# Patient Record
Sex: Male | Born: 1965 | Race: Black or African American | Hispanic: No | Marital: Single | Smoking: Never smoker
Health system: Southern US, Community
[De-identification: ages and names within clinical notes are randomized; demographics above are authoritative.]

## PROBLEM LIST (undated history)

## (undated) DIAGNOSIS — L03012 Cellulitis of left finger: Secondary | ICD-10-CM

## (undated) DIAGNOSIS — M109 Gout, unspecified: Secondary | ICD-10-CM

## (undated) DIAGNOSIS — Z22322 Carrier or suspected carrier of Methicillin resistant Staphylococcus aureus: Secondary | ICD-10-CM

## (undated) DIAGNOSIS — I10 Essential (primary) hypertension: Secondary | ICD-10-CM

## (undated) DIAGNOSIS — M869 Osteomyelitis, unspecified: Secondary | ICD-10-CM

## (undated) DIAGNOSIS — L089 Local infection of the skin and subcutaneous tissue, unspecified: Secondary | ICD-10-CM

## (undated) DIAGNOSIS — K219 Gastro-esophageal reflux disease without esophagitis: Secondary | ICD-10-CM

## (undated) HISTORY — DX: Essential (primary) hypertension: I10

---

## 2002-03-22 ENCOUNTER — Emergency Department (HOSPITAL_COMMUNITY): Admission: EM | Admit: 2002-03-22 | Discharge: 2002-03-23 | Payer: Self-pay | Admitting: Emergency Medicine

## 2004-07-29 ENCOUNTER — Emergency Department (HOSPITAL_COMMUNITY): Admission: EM | Admit: 2004-07-29 | Discharge: 2004-07-29 | Payer: Self-pay | Admitting: Emergency Medicine

## 2006-08-10 ENCOUNTER — Emergency Department (HOSPITAL_COMMUNITY): Admission: EM | Admit: 2006-08-10 | Discharge: 2006-08-10 | Payer: Self-pay | Admitting: Emergency Medicine

## 2011-02-25 ENCOUNTER — Emergency Department (HOSPITAL_COMMUNITY)
Admission: EM | Admit: 2011-02-25 | Discharge: 2011-02-25 | Disposition: A | Discharge status: Home / Self Care | Payer: Self-pay | Attending: Emergency Medicine | Admitting: Emergency Medicine

## 2011-02-25 ENCOUNTER — Emergency Department (HOSPITAL_COMMUNITY): Payer: Self-pay

## 2011-02-25 DIAGNOSIS — M7730 Calcaneal spur, unspecified foot: Secondary | ICD-10-CM | POA: Insufficient documentation

## 2011-02-25 DIAGNOSIS — M79609 Pain in unspecified limb: Secondary | ICD-10-CM | POA: Insufficient documentation

## 2011-03-08 ENCOUNTER — Ambulatory Visit (INDEPENDENT_AMBULATORY_CARE_PROVIDER_SITE_OTHER): Payer: Self-pay | Admitting: Surgery

## 2011-12-13 ENCOUNTER — Emergency Department (INDEPENDENT_AMBULATORY_CARE_PROVIDER_SITE_OTHER)
Admission: EM | Admit: 2011-12-13 | Discharge: 2011-12-13 | Disposition: A | Discharge status: Nursing Home (Includes ICF) | Payer: Self-pay | Source: Home / Self Care | Attending: Family Medicine | Admitting: Family Medicine

## 2011-12-13 ENCOUNTER — Emergency Department (HOSPITAL_COMMUNITY): Payer: Self-pay

## 2011-12-13 ENCOUNTER — Encounter (HOSPITAL_COMMUNITY): Payer: Self-pay | Admitting: *Deleted

## 2011-12-13 ENCOUNTER — Emergency Department (HOSPITAL_COMMUNITY)
Admission: EM | Admit: 2011-12-13 | Discharge: 2011-12-13 | Disposition: A | Discharge status: Home / Self Care | Payer: Self-pay | Attending: Emergency Medicine | Admitting: Emergency Medicine

## 2011-12-13 DIAGNOSIS — R51 Headache: Secondary | ICD-10-CM

## 2011-12-13 DIAGNOSIS — R55 Syncope and collapse: Secondary | ICD-10-CM

## 2011-12-13 DIAGNOSIS — R42 Dizziness and giddiness: Secondary | ICD-10-CM | POA: Insufficient documentation

## 2011-12-13 DIAGNOSIS — R5381 Other malaise: Secondary | ICD-10-CM | POA: Insufficient documentation

## 2011-12-13 DIAGNOSIS — R531 Weakness: Secondary | ICD-10-CM

## 2011-12-13 DIAGNOSIS — R630 Anorexia: Secondary | ICD-10-CM | POA: Insufficient documentation

## 2011-12-13 DIAGNOSIS — R5383 Other fatigue: Secondary | ICD-10-CM | POA: Insufficient documentation

## 2011-12-13 LAB — URINE MICROSCOPIC-ADD ON

## 2011-12-13 LAB — DIFFERENTIAL
Basophils Absolute: 0 10*3/uL (ref 0.0–0.1)
Basophils Relative: 1 % (ref 0–1)
Eosinophils Absolute: 0 10*3/uL (ref 0.0–0.7)
Eosinophils Relative: 0 % (ref 0–5)
Lymphocytes Relative: 38 % (ref 12–46)
Lymphs Abs: 1.3 10*3/uL (ref 0.7–4.0)
Monocytes Absolute: 0.4 10*3/uL (ref 0.1–1.0)
Neutro Abs: 1.7 10*3/uL (ref 1.7–7.7)
Neutrophils Relative %: 50 % (ref 43–77)

## 2011-12-13 LAB — COMPREHENSIVE METABOLIC PANEL
ALT: 22 U/L (ref 0–53)
AST: 38 U/L — ABNORMAL HIGH (ref 0–37)
Albumin: 3.8 g/dL (ref 3.5–5.2)
Alkaline Phosphatase: 85 U/L (ref 39–117)
BUN: 11 mg/dL (ref 6–23)
CO2: 22 mEq/L (ref 19–32)
Calcium: 8.9 mg/dL (ref 8.4–10.5)
Chloride: 100 mEq/L (ref 96–112)
Creatinine, Ser: 1.19 mg/dL (ref 0.50–1.35)
GFR calc Af Amer: 82 mL/min — ABNORMAL LOW (ref >90)
GFR calc non Af Amer: 71 mL/min — ABNORMAL LOW (ref >90)
Glucose, Bld: 88 mg/dL (ref 70–99)
Potassium: 3.2 mEq/L — ABNORMAL LOW (ref 3.5–5.1)
Sodium: 135 mEq/L (ref 135–145)
Total Bilirubin: 0.4 mg/dL (ref 0.3–1.2)

## 2011-12-13 LAB — CBC
HCT: 47.3 % (ref 39.0–52.0)
Hemoglobin: 16.8 g/dL (ref 13.0–17.0)
MCHC: 35.5 g/dL (ref 30.0–36.0)
MCV: 85.7 fL (ref 78.0–100.0)
Platelets: 216 10*3/uL (ref 150–400)
RBC: 5.52 MIL/uL (ref 4.22–5.81)
RDW: 13.1 % (ref 11.5–15.5)
WBC: 3.4 10*3/uL — ABNORMAL LOW (ref 4.0–10.5)

## 2011-12-13 LAB — URINALYSIS, ROUTINE W REFLEX MICROSCOPIC
Bilirubin Urine: NEGATIVE
Hgb urine dipstick: NEGATIVE
Ketones, ur: 15 mg/dL — AB
Leukocytes, UA: NEGATIVE
Nitrite: NEGATIVE
Protein, ur: 30 mg/dL — AB
Specific Gravity, Urine: 1.016 (ref 1.005–1.030)
Urobilinogen, UA: 1 mg/dL (ref 0.0–1.0)

## 2011-12-13 LAB — POCT I-STAT TROPONIN I

## 2011-12-13 LAB — GLUCOSE, CAPILLARY: Glucose-Capillary: 128 mg/dL — ABNORMAL HIGH (ref 70–99)

## 2011-12-13 MED ORDER — POTASSIUM CHLORIDE CRYS ER 20 MEQ PO TBCR
40.0000 meq | EXTENDED_RELEASE_TABLET | Freq: Once | ORAL | Status: AC
  Administered 2011-12-13: 40 meq via ORAL
  Filled 2011-12-13: qty 2

## 2011-12-13 NOTE — Discharge Instructions (Signed)
Transferred to Emergency Department for further evaluation.

## 2011-12-13 NOTE — Discharge Instructions (Signed)
Your lab work showed slightly low potassium level, otherwise normal. Make sure you eat well and get plenty of rest. When standing up, take your time, so you dont get dizzy again. Please find a primary care doctor and follow up with them. Return if worsening.   Near-Syncope Near-syncope is sudden weakness, dizziness, or feeling like you might pass out (faint). This may occur when getting up after sitting or while standing for a long period of time. Near-syncope can be caused by a drop in blood pressure. This is a common reaction, but it may occur to a greater degree in people taking medicines to control their blood pressure. Fainting often occurs when the blood pressure or pulse is too low to provide enough blood flow to the brain to keep you conscious. Fainting and near-syncope are not usually due to serious medical problems. However, certain people should be more cautious in the event of near-syncope, including elderly patients, patients with diabetes, and patients with a history of heart conditions (especially irregular rhythms).  CAUSES   Drop in blood pressure.   Physical pain.   Dehydration.   Heat exhaustion.   Emotional distress.   Low blood sugar.   Internal bleeding.   Heart and circulatory problems.   Infections.  SYMPTOMS   Dizziness.   Feeling sick to your stomach (nauseous).   Nearly fainting.   Body numbness.   Turning pale.   Tunnel vision.   Weakness.  HOME CARE INSTRUCTIONS   Lie down right away if you start feeling like you might faint. Breathe deeply and steadily. Wait until all the symptoms have passed. Most of these episodes last only a few minutes. You may feel tired for several hours.   Drink enough fluids to keep your urine clear or pale yellow.   If you are taking blood pressure or heart medicine, get up slowly, taking several minutes to sit and then stand. This can reduce dizziness that is caused by a drop in blood pressure.  SEEK IMMEDIATE  MEDICAL CARE IF:   You have a severe headache.   Unusual pain develops in the chest, abdomen, or back.   There is bleeding from the mouth or rectum, or you have black or tarry stool.   An irregular heartbeat or a very rapid pulse develops.   You have repeated fainting or seizure-like jerking during an episode.   You faint when sitting or lying down.   You develop confusion.   You have difficulty walking.   Severe weakness develops.   Vision problems develop.  MAKE SURE YOU:   Understand these instructions.   Will watch your condition.   Will get help right away if you are not doing well or get worse.  Document Released: 08/13/2005 Document Revised: 08/02/2011 Document Reviewed: 09/29/2010 [employer] Patient Information 2012 [place], [place].

## 2011-12-13 NOTE — ED Notes (Addendum)
FRnces  Has  Been  sick this week  He  Has  Had  Diarrhea  Loss  Of  appetite   And  Some  Weakness to  Boot  -  He  Reports  At  Work today  He  Got  Week  -layed  Down  And  Carried  To a  Room by [employer] workers  -  He  States  He  Was  Too weak to move  For  About 10  mins  But remembers  Everything    -  He  Is  Awake alert  Ambulates  With   A   Steady  Fluid  Gait -  His mucous membranes  Are  Dry  However  - His sclera  Is   Slightly  Pale  Looking however -  Pt states  His  Wife noticed  This  As  Well  Earlier - he  denys  Any pain now  Or  Before

## 2011-12-13 NOTE — ED Provider Notes (Addendum)
Medical screening examination/treatment/procedure(s) were conducted as a shared visit with non-physician practitioner(s) and myself.  I personally evaluated the patient during the encounter  Pt with a near syncopal event at work.  PT complains of mild headache but otherwise no significant symptoms.  On exam alert in no distress.   No meningismus.  5/5 strength bilateral UE and LE.  No facial droop.    Will check labs, EKG, head ct.  Low suspicion of SAH.   Rate: 69  Rhythm: normal sinus rhythm  QRS Axis: normal  Intervals: normal  ST/T Wave abnormalities: normal except early repol pattern anterior  Conduction Disutrbances:none  Narrative Interpretation: no sig changes  Old EKG Reviewed: none available    Celene Kras, MD 12/13/11 1407

## 2011-12-13 NOTE — ED Notes (Signed)
Pt sent here from [facility] for further eval of near syncpal episode this am while at work. Also reported having right sided headache and numbness to fingertips since yesterday. No neuro deficits noted.

## 2011-12-13 NOTE — ED Notes (Signed)
Reports had a syncopal episode at work today, denies injury. States felt really hot, flushed, nauseous no emesis & lightheaded prior to syncopal episode. No CP, SOB, palpitations, n/v/d, fever, cold, cough.  C/o headache left temple since yesterday, describes it as nagging. Took advil with some relief. Denies blurred vision. Denies nausea presently. Also c/o of decreased appetite past week with poor oral intake.

## 2011-12-13 NOTE — ED Provider Notes (Signed)
History     CSN: [id number]  Arrival date & time 12/13/11  1141   First MD Initiated Contact with Patient 12/13/11 1152      Chief Complaint  Patient presents with  . Weakness    (Consider location/radiation/quality/duration/timing/severity/associated sxs/prior treatment) HPI Comments: Rickardo presents for evaluation of presyncopal episode happened earlier this morning. He reports that he was at work this morning when he bent over to put down a water bottle, when he stood up he became dizzy lightheaded and fell backwards. He denies striking his head or complete loss of consciousness. He does report persistent weakness and lightheadedness after the event, and had to be helped up to his feet by 2 other people. Upon further interview, he reports a right-sided headache since yesterday, numbness in his fingertips yesterday. He also reports a disturbance in his taste since Tuesday of this week. He denies any nausea or vomiting. He denies any visual disturbance. He denies any significant past medical history, does not smoke, denies alcohol. He denies any seizure history. He has no personal or family history of diabetes. He does not take any medications and is not taking any recent over-the-counter medications. He does not have a primary care doctor, but he is seeing a surgeon in the near future for evaluation of a lesion on the top of his scalp. He denies any history of headaches, migraines or otherwise.  Patient is a 46 y.o. male presenting with weakness. The history is provided by the patient.  Weakness The primary symptoms include headaches, dizziness and paresthesias. Primary symptoms do not include syncope, loss of consciousness, altered mental status, seizures, visual change, focal weakness, loss of sensation, speech change, memory loss, nausea or vomiting. The symptoms began yesterday. The symptoms are unchanged. The neurological symptoms are focal. The symptoms occurred after standing up.  The  headache began yesterday. Headache is a new problem. Location/region(s) of the headache: right unilateral. The headache is associated with paresthesias, weakness and loss of balance. The headache is not associated with aura, eye pain or visual change.  He describes the dizziness as lightheadedness. The dizziness began today. The dizziness has been unchanged since its onset. Dizziness also occurs with weakness. Dizziness does not occur with blurred vision, tinnitus, hearing loss, nausea, vomiting or diaphoresis.   Additional symptoms include weakness and loss of balance. Additional symptoms do not include aura or tinnitus. Medical issues do not include seizures, cerebral vascular accident, cancer, alcohol use, drug use, diabetes, hypertension or recent surgery.    History reviewed. No pertinent past medical history.  History reviewed. No pertinent past surgical history.  History reviewed. No pertinent family history.  History  Substance Use Topics  . Smoking status: Not on file  . Smokeless tobacco: Not on file  . Alcohol Use: Not on file      Review of Systems  Constitutional: Negative for diaphoresis.  HENT: Negative for tinnitus.   Eyes: Negative.  Negative for blurred vision and pain.  Respiratory: Negative.   Cardiovascular: Negative.  Negative for syncope.  Gastrointestinal: Negative.  Negative for nausea and vomiting.  Genitourinary: Negative.   Musculoskeletal: Negative.   Skin: Negative.   Neurological: Positive for dizziness, weakness, light-headedness, numbness, headaches, paresthesias and loss of balance. Negative for speech change, focal weakness, seizures, loss of consciousness and syncope.  Psychiatric/Behavioral: Negative for memory loss and altered mental status.    Allergies  Review of patient's allergies indicates no known allergies.  Home Medications  No current outpatient prescriptions on file.  BP 122/81  Pulse 78  Temp(Src) 98 F (36.7 C) (Oral)   Resp 18  SpO2 99%  Physical Exam  Nursing note and vitals reviewed. Constitutional: He is oriented to person, place, and time. He appears well-developed and well-nourished.  HENT:  Head: Normocephalic and atraumatic.  Mouth/Throat: Oropharynx is clear and moist.  Eyes: EOM are normal. Pupils are equal, round, and reactive to light.  Neck: Normal range of motion.  Cardiovascular: Normal rate, regular rhythm, S1 normal, S2 normal and normal heart sounds.   No murmur heard.      ECG shows normal sinus rhythm, rate 66 bpm, no ST or T wave changes, no interval abnormalities  Pulmonary/Chest: Effort normal and breath sounds normal. He has no decreased breath sounds. He has no wheezes. He has no rhonchi.  Musculoskeletal: Normal range of motion.  Neurological: He is alert and oriented to person, place, and time. He has normal strength. No cranial nerve deficit or sensory deficit. He displays a negative Romberg sign. Coordination and gait normal. GCS eye subscore is 4. GCS verbal subscore is 5. GCS motor subscore is 6.  Skin: Skin is warm and dry.  Psychiatric: His behavior is normal.    ED Course  Procedures (including critical care time)  Labs Reviewed  GLUCOSE, CAPILLARY - Abnormal; Notable for the following:    Glucose-Capillary 128 (*)    All other components within normal limits   No results found.   1. Pre-syncope   2. Headache       MDM  Transferred to Emergency Department for further evaluation; labs reviewed, normal ECG, normal glucose        Renaee Munda, MD 12/13/11 1237

## 2011-12-13 NOTE — ED Provider Notes (Signed)
History     CSN: [id number]  Arrival date & time 12/13/11  1304   First MD Initiated Contact with Patient 12/13/11 1325      Chief Complaint  Patient presents with  . Weakness  . Headache    (Consider location/radiation/quality/duration/timing/severity/associated sxs/prior treatment) HPI Comments: Pt is a 46yo male, here for evaluation of syncopal episode earlier today at work. Pt states he woke up this morning feeling weak. States also has had a headache since last night which is mild but constate, about 2/10. Pt states at work, he stood up while at a meeting and became very dizzy, lightheaded, and fell backwards. States he does not believe he lost consciousness. Denies hitting his head. States he is actually feeling better now. States recently got a new job. States working a lot of hours. Also states he lost appetite and has not been eating or drinking anything for last week. No other complaints. No fever, chills, URI symptoms, cough, CP, abdominal pain.  Patient is a 46 y.o. male presenting with weakness.  Weakness The primary symptoms include headaches and dizziness. Primary symptoms do not include fever, nausea or vomiting.  The headache is associated with weakness. The headache is not associated with eye pain or neck stiffness.  Dizziness also occurs with weakness. Dizziness does not occur with nausea or vomiting.   Additional symptoms include weakness. Additional symptoms do not include neck stiffness.    History reviewed. No pertinent past medical history.  History reviewed. No pertinent past surgical history.  History reviewed. No pertinent family history.  History  Substance Use Topics  . Smoking status: Not on file  . Smokeless tobacco: Not on file  . Alcohol Use: Not on file      Review of Systems  Constitutional: Positive for appetite change and fatigue. Negative for fever, chills and unexpected weight change.  HENT: Negative for neck pain and neck stiffness.    Eyes: Negative for pain and visual disturbance.  Respiratory: Negative for cough, chest tightness and shortness of breath.   Cardiovascular: Negative for chest pain, palpitations and leg swelling.  Gastrointestinal: Negative for nausea, vomiting and abdominal pain.  Genitourinary: Negative.   Musculoskeletal: Negative.   Skin: Negative.   Neurological: Positive for dizziness, syncope, weakness, light-headedness and headaches. Negative for numbness.  Psychiatric/Behavioral: Negative.     Allergies  Review of patient's allergies indicates no known allergies.  Home Medications  No current outpatient prescriptions on file.  BP 111/67  Pulse 71  Temp(Src) 98.5 F (36.9 C) (Oral)  Resp 18  SpO2 100%  Physical Exam  Nursing note and vitals reviewed. Constitutional: He is oriented to person, place, and time. He appears well-developed and well-nourished.  HENT:  Head: Normocephalic and atraumatic.  Right Ear: External ear normal.  Left Ear: External ear normal.  Nose: Nose normal.  Mouth/Throat: Oropharynx is clear and moist.  Eyes: Conjunctivae and EOM are normal. Pupils are equal, round, and reactive to light.  Neck: Normal range of motion. Neck supple.  Cardiovascular: Normal rate, regular rhythm and normal heart sounds.   Pulmonary/Chest: Effort normal and breath sounds normal. No respiratory distress. He has no wheezes. He has no rales.  Abdominal: Soft. Bowel sounds are normal. There is no tenderness.  Musculoskeletal: He exhibits no edema.  Neurological: He is alert and oriented to person, place, and time. No cranial nerve deficit. Coordination normal.       No pronator drift bilaterally, 5/5 and equal upper extremity and grip strength. 5/5 and  equal LE strength. Normal gait  Skin: Skin is warm and dry.  Psychiatric: He has a normal mood and affect.    ED Course  Procedures (including critical care time)  Pt with a presyncopal episode earlier today while standing up at  work. Pt states he feels well now. Will get labs, ECG, CT head. Will monitor.   Results for orders placed during the hospital encounter of 12/13/11  CBC      Component Value Range   WBC 3.4 (*) 4.0 - 10.5 (K/uL)   RBC 5.52  4.22 - 5.81 (MIL/uL)   Hemoglobin 16.8  13.0 - 17.0 (g/dL)   HCT 04.5  40.9 - 81.1 (%)   MCV 85.7  78.0 - 100.0 (fL)   MCH 30.4  26.0 - 34.0 (pg)   MCHC 35.5  30.0 - 36.0 (g/dL)   RDW 91.4  78.2 - 95.6 (%)   Platelets 216  150 - 400 (K/uL)  DIFFERENTIAL      Component Value Range   Neutrophils Relative 50  43 - 77 (%)   Neutro Abs 1.7  1.7 - 7.7 (K/uL)   Lymphocytes Relative 38  12 - 46 (%)   Lymphs Abs 1.3  0.7 - 4.0 (K/uL)   Monocytes Relative 11  3 - 12 (%)   Monocytes Absolute 0.4  0.1 - 1.0 (K/uL)   Eosinophils Relative 0  0 - 5 (%)   Eosinophils Absolute 0.0  0.0 - 0.7 (K/uL)   Basophils Relative 1  0 - 1 (%)   Basophils Absolute 0.0  0.0 - 0.1 (K/uL)  COMPREHENSIVE METABOLIC PANEL      Component Value Range   Sodium 135  135 - 145 (mEq/L)   Potassium 3.2 (*) 3.5 - 5.1 (mEq/L)   Chloride 100  96 - 112 (mEq/L)   CO2 22  19 - 32 (mEq/L)   Glucose, Bld 88  70 - 99 (mg/dL)   BUN 11  6 - 23 (mg/dL)   Creatinine, Ser 2.13  0.50 - 1.35 (mg/dL)   Calcium 8.9  8.4 - 08.6 (mg/dL)   Total Protein 7.7  6.0 - 8.3 (g/dL)   Albumin 3.8  3.5 - 5.2 (g/dL)   AST 38 (*) 0 - 37 (U/L)   ALT 22  0 - 53 (U/L)   Alkaline Phosphatase 85  39 - 117 (U/L)   Total Bilirubin 0.4  0.3 - 1.2 (mg/dL)   GFR calc non Af Amer 71 (*) >90 (mL/min)   GFR calc Af Amer 82 (*) >90 (mL/min)  URINALYSIS, ROUTINE W REFLEX MICROSCOPIC      Component Value Range   Color, Urine YELLOW  YELLOW    APPearance CLEAR  CLEAR    Specific Gravity, Urine 1.016  1.005 - 1.030    pH 6.0  5.0 - 8.0    Glucose, UA NEGATIVE  NEGATIVE (mg/dL)   Hgb urine dipstick NEGATIVE  NEGATIVE    Bilirubin Urine NEGATIVE  NEGATIVE    Ketones, ur 15 (*) NEGATIVE (mg/dL)   Protein, ur 30 (*) NEGATIVE (mg/dL)    Urobilinogen, UA 1.0  0.0 - 1.0 (mg/dL)   Nitrite NEGATIVE  NEGATIVE    Leukocytes, UA NEGATIVE  NEGATIVE   POCT I-STAT TROPONIN I      Component Value Range   Troponin i, poc 0.00  0.00 - 0.08 (ng/mL)   Comment 3           URINE MICROSCOPIC-ADD ON      Component  Value Range   WBC, UA 0-2  <3 (WBC/hpf)   Urine-Other MUCOUS PRESENT     Ct Head Wo Contrast  12/13/2011  *RADIOLOGY REPORT*  Clinical Data: Presyncope with dizziness and headache.  Fingertip numbness.  CT HEAD WITHOUT CONTRAST  Technique:  Contiguous axial images were obtained from the base of the skull through the vertex without contrast.  Comparison: 07/29/2004.  Findings: No evidence of acute infarct, acute hemorrhage, mass lesion, mass effect or hydrocephalus.  Minimal mucosal thickening in the ethmoid air cells.  No air fluid levels.  Mastoid air cells are clear.  IMPRESSION: No acute intracranial abnormality.  Original Report Authenticated By: Reyes Ivan, M.D.    3:24 PM Pts labs un remarkable. He is in no distress. VS normal. He is not orthostatic. Normal neuro exam. ECG unremarkable. I think pt OK for d/c home with close follow up.   1. Weakness   2. Near syncope       MDM          Lottie Mussel, [place] 12/13/11 [phone]

## 2012-12-04 ENCOUNTER — Telehealth: Payer: Self-pay | Admitting: Nurse Practitioner

## 2012-12-05 NOTE — Telephone Encounter (Signed)
Do not have chart

## 2012-12-05 NOTE — Telephone Encounter (Signed)
Chart was on desk

## 2012-12-05 NOTE — Telephone Encounter (Signed)
Need chart

## 2015-09-13 ENCOUNTER — Emergency Department (HOSPITAL_COMMUNITY)
Admission: EM | Admit: 2015-09-13 | Discharge: 2015-09-13 | Disposition: A | Discharge status: Home / Self Care | Payer: Medicaid Other | Attending: Emergency Medicine | Admitting: Emergency Medicine

## 2015-09-13 ENCOUNTER — Encounter (HOSPITAL_COMMUNITY): Payer: Self-pay

## 2015-09-13 ENCOUNTER — Emergency Department (HOSPITAL_COMMUNITY)
Admission: EM | Admit: 2015-09-13 | Discharge: 2015-09-13 | Disposition: A | Discharge status: Home / Self Care | Payer: Self-pay | Attending: Emergency Medicine | Admitting: Emergency Medicine

## 2015-09-13 ENCOUNTER — Emergency Department (HOSPITAL_COMMUNITY): Payer: Self-pay

## 2015-09-13 DIAGNOSIS — Z79899 Other long term (current) drug therapy: Secondary | ICD-10-CM

## 2015-09-13 DIAGNOSIS — Z760 Encounter for issue of repeat prescription: Secondary | ICD-10-CM | POA: Insufficient documentation

## 2015-09-13 DIAGNOSIS — M109 Gout, unspecified: Secondary | ICD-10-CM | POA: Insufficient documentation

## 2015-09-13 LAB — I-STAT CHEM 8, ED
BUN: 12 mg/dL (ref 6–20)
Calcium, Ion: 1.21 mmol/L (ref 1.12–1.23)
Chloride: 105 mmol/L (ref 101–111)
Creatinine, Ser: 1.2 mg/dL (ref 0.61–1.24)
Glucose, Bld: 96 mg/dL (ref 65–99)
HEMATOCRIT: 49 % (ref 39.0–52.0)
HEMOGLOBIN: 16.7 g/dL (ref 13.0–17.0)
Potassium: 4 mmol/L (ref 3.5–5.1)
SODIUM: 142 mmol/L (ref 135–145)
TCO2: 26 mmol/L (ref 0–100)

## 2015-09-13 LAB — URIC ACID: URIC ACID, SERUM: 9 mg/dL — AB (ref 4.4–7.6)

## 2015-09-13 MED ORDER — OXYCODONE-ACETAMINOPHEN 5-325 MG PO TABS
ORAL_TABLET | ORAL | Status: AC
  Filled 2015-09-13: qty 1

## 2015-09-13 MED ORDER — OXYCODONE-ACETAMINOPHEN 5-325 MG PO TABS
1.0000 | ORAL_TABLET | Freq: Once | ORAL | Status: AC
  Administered 2015-09-13: 1 via ORAL

## 2015-09-13 MED ORDER — OXYCODONE-ACETAMINOPHEN 5-325 MG PO TABS
1.0000 | ORAL_TABLET | Freq: Once | ORAL | Status: AC
  Administered 2015-09-13: 1 via ORAL
  Filled 2015-09-13: qty 1

## 2015-09-13 MED ORDER — OXYCODONE-ACETAMINOPHEN 5-325 MG PO TABS: 1.0000 | ORAL_TABLET | Freq: Four times a day (QID) | ORAL | Status: DC | PRN

## 2015-09-13 MED ORDER — IBUPROFEN 600 MG PO TABS: 600.0000 mg | ORAL_TABLET | Freq: Four times a day (QID) | ORAL | Status: DC | PRN

## 2015-09-13 NOTE — ED Notes (Signed)
Patient verbalized understanding of discharge instructions and denies any further needs or questions at this time. VS stable. Patient ambulatory with steady gait. Assisted to ED entrance in wheelchair.   

## 2015-09-13 NOTE — Discharge Instructions (Signed)

## 2015-09-13 NOTE — ED Notes (Addendum)
Pt here for pain and swelling to his right foot that last several days. No known injury. The swelling has gotten worse. Friend also states she mashed a bump on his back and got some black stuff out of it the other night and not sure if he needs antibiotics.

## 2015-09-13 NOTE — ED Notes (Signed)
Patient reports the first Percocet he was given out in triage did not do anything for his pain and asked if the MD could change it to something else. MD made aware and order remains the same. Explained this to the patient, who now states he will take the Percocet.

## 2015-09-13 NOTE — ED Notes (Signed)
Patient was seen by Dr. Wilkie Aye last pm and did not get prescriptions, here for same

## 2015-09-13 NOTE — Discharge Instructions (Signed)
Fill the prescriptions you have at home and follow you discharge instruction from yesterday. Return for any problems.

## 2015-09-13 NOTE — ED Provider Notes (Signed)
CSN: [id number]     Arrival date & time 09/13/15  0055 History  By signing my name below, I, Tanda Rockers, attest that this documentation has been prepared under the direction and in the presence of Shon Baton, MD. Electronically Signed: Tanda Rockers, ED Scribe. 09/13/2015. 3:09 AM.   Chief Complaint  Patient presents with  . Foot Swelling  . Foot Pain   The history is provided by the patient. No language interpreter was used.     HPI Comments: Brian Erickson is a 50 y.o. male who presents to the Emergency Department complaining of gradual onset, constant, 8/10, right foot pain and swelling x 2 days. Pt reports that he recently bought new boots but does not attribute this to his symptoms due to him having similar intermittent pain for the past couple of months. No known injury or trauma to the foot. He has been taking Advil with some relief. Pt does have a Fhx of gout. No personal history.  Denies EtOH but states he does occasionally eat pork. Denies color change, fever, chills, any other associated symptoms.   History reviewed. No pertinent past medical history. History reviewed. No pertinent past surgical history. No family history on file. Social History  Substance Use Topics  . Smoking status: Never Smoker   . Smokeless tobacco: None  . Alcohol Use: Yes     Comment: socially     Review of Systems  Constitutional: Negative for fever and chills.  Musculoskeletal: Positive for joint swelling and arthralgias (Right foot. ).  Skin: Negative for color change.  All other systems reviewed and are negative.  Allergies  Review of patient's allergies indicates no known allergies.  Home Medications   Prior to Admission medications   Medication Sig Start Date End Date Taking? Authorizing Provider  ibuprofen (ADVIL,MOTRIN) 600 MG tablet Take 1 tablet (600 mg total) by mouth every 6 (six) hours as needed. 09/13/15   Shon Baton, MD  oxyCODONE-acetaminophen  (PERCOCET/ROXICET) 5-325 MG tablet Take 1-2 tablets by mouth every 6 (six) hours as needed for severe pain. 09/13/15   Shon Baton, MD   Triage Vitals:  BP 150/93 mmHg  Pulse 56  Temp(Src) 98.3 F (36.8 C) (Oral)  Resp 18  Ht  (1.753 m)  Wt 194 lb 9.6 oz (88.27 kg)  BMI 28.72 kg/m2  SpO2 100%   Physical Exam  Constitutional: He is oriented to person, place, and time. He appears well-developed and well-nourished. No distress.  HENT:  Head: Normocephalic and atraumatic.  Cardiovascular: Normal rate and regular rhythm.   Pulmonary/Chest: Effort normal. No respiratory distress.  Musculoskeletal: He exhibits no edema.   Mild swelling over the dorsum of the right foot and ankle, no significant erythema, tenderness over the ankle foot , no plantar tenderness, 2+ DP pulse  Neurological: He is alert and oriented to person, place, and time.  Skin: Skin is warm and dry.  Psychiatric: He has a normal mood and affect.  Nursing note and vitals reviewed.   ED Course  Procedures (including critical care time)  DIAGNOSTIC STUDIES: Oxygen Saturation is 100% on RA, normal by my interpretation.    COORDINATION OF CARE: 3:07 AM-Discussed treatment plan which includes uric acid, chem 8 with pt at bedside and pt agreed to plan.   Labs Review Labs Reviewed  URIC ACID - Abnormal; Notable for the following:    Uric Acid, Serum 9.0 (*)    All other components within normal limits  I-STAT CHEM  8, ED    Imaging Review Dg Foot Complete Right  09/13/2015  CLINICAL DATA:  RIGHT foot swelling for 2 days, no injury. Plantar midfoot pain. EXAM: RIGHT FOOT COMPLETE - 3+ VIEW COMPARISON:  RIGHT foot radiograph February 25, 2011 FINDINGS: There is no evidence of fracture or dislocation. Small plantar calcaneal spur. There is no evidence of arthropathy or other focal bone abnormality. Soft tissues are unremarkable. IMPRESSION: Small plantar calcaneal spur.  No acute osseous process. Electronically Signed    By: Awilda Metro M.D.   On: 09/13/2015 01:36   I have personally reviewed and evaluated these images and lab results as part of my medical decision-making.   EKG Interpretation None      MDM   Final diagnoses:  Acute gout of right foot, unspecified cause    patient presents with pain and swelling of the right foot. Reports similar symptoms prior. No prior history of gout. Nontoxic on exam.   Afebrile, no signs or symptoms of infection. Suspect gout. Patient was given  Percocet. Chem-8 shows normal renal function. Will place on ibuprofen. Uric acid is 9.0. Discussed with patient  Treatment for acute gout including anti-inflammatories and Percocet.  Diet  Is also discussed.  After history, exam, and medical workup I feel the patient has been appropriately medically screened and is safe for discharge home. Pertinent diagnoses were discussed with the patient. Patient was given return precautions.  I personally performed the services described in this documentation, which was scribed in my presence. The recorded information has been reviewed and is accurate.      Shon Baton, MD 09/13/15 [phone]

## 2015-09-13 NOTE — ED Provider Notes (Signed)
CSN: [id number]     Arrival date & time 09/13/15  1302 History   By signing my name below, I, Jarvis Morgan, attest that this documentation has been prepared under the direction and in the presence of  Kerrie Buffalo, NP  Electronically Signed: Jarvis Morgan, ED Scribe. 09/14/2015. 11:52 PM.    Chief Complaint  Patient presents with  . med refill    The history is provided by the patient and medical records. No language interpreter was used.    HPI Comments: Brian Erickson is a 50 y.o. male who presents to the Emergency Department for a med refill. Pt was seen by Dr. Wilkie Aye last night and was prescribed Percocet and Ibuprofen for his foot pain but patient initially thought he did not receive the prescription. Pt notes that he called home while in the room and his girlfriend told him the medication rx was there. He has no other complaints at this time.   No past medical history on file. No past surgical history on file. No family history on file. Social History  Substance Use Topics  . Smoking status: Never Smoker   . Smokeless tobacco: Not on file  . Alcohol Use: Yes     Comment: socially     Review of Systems  Musculoskeletal:       Right foot pain  All other systems reviewed and are negative.     Allergies  Review of patient's allergies indicates no known allergies.  Home Medications   Prior to Admission medications   Medication Sig Start Date End Date Taking? Authorizing Provider  ibuprofen (ADVIL,MOTRIN) 600 MG tablet Take 1 tablet (600 mg total) by mouth every 6 (six) hours as needed. 09/13/15   Shon Baton, MD  oxyCODONE-acetaminophen (PERCOCET/ROXICET) 5-325 MG tablet Take 1-2 tablets by mouth every 6 (six) hours as needed for severe pain. 09/13/15   Shon Baton, MD   BP 140/80 mmHg  Pulse 70  Temp(Src) 98 F (36.7 C) (Oral)  Resp 16  SpO2 98% Physical Exam  Constitutional: He is oriented to person, place, and time. He appears well-developed and  well-nourished. No distress.  HENT:  Head: Normocephalic.  Eyes: EOM are normal.  Neck: Neck supple.  Cardiovascular: Normal rate.   Pulmonary/Chest: Effort normal.  Neurological: He is alert and oriented to person, place, and time. No cranial nerve deficit.  Skin: Skin is warm and dry.  Psychiatric: He has a normal mood and affect. His behavior is normal.  Nursing note and vitals reviewed.   ED Course  Procedures   DIAGNOSTIC STUDIES: Oxygen Saturation is 99% on RA, normal by my interpretation.    COORDINATION OF CARE: 2:19 PM- Pt requesting discharge at this time. Will d/c pt home. Pt advised of plan for treatment and pt agrees.   Labs Review Labs Reviewed - No data to display  Imaging Review Dg Foot Complete Right  09/13/2015  CLINICAL DATA:  RIGHT foot swelling for 2 days, no injury. Plantar midfoot pain. EXAM: RIGHT FOOT COMPLETE - 3+ VIEW COMPARISON:  RIGHT foot radiograph February 25, 2011 FINDINGS: There is no evidence of fracture or dislocation. Small plantar calcaneal spur. There is no evidence of arthropathy or other focal bone abnormality. Soft tissues are unremarkable. IMPRESSION: Small plantar calcaneal spur.  No acute osseous process. Electronically Signed   By: Awilda Metro M.D.   On: 09/13/2015 01:36   I have personally reviewed and evaluated these images and lab results as part of my medical  decision-making.   MDM  50 y.o. male here for Rx that he thought he did not get last night but he called his friend at home and she found the Rx stapled to the front of the instructions. Patient stable for d/c without any further evaluation. He will get his Rx filled and follow up as planned.   Final diagnoses:  Medication management   I personally performed the services described in this documentation, which was scribed in my presence. The recorded information has been reviewed and is accurate.     [address] Reyno, NP 09/14/15 2355  Lyndal Pulley, MD 09/15/15 [phone]

## 2016-02-20 ENCOUNTER — Encounter (HOSPITAL_COMMUNITY): Payer: Self-pay | Admitting: Emergency Medicine

## 2016-02-20 ENCOUNTER — Emergency Department (HOSPITAL_COMMUNITY)
Admission: EM | Admit: 2016-02-20 | Discharge: 2016-02-20 | Disposition: A | Discharge status: Home / Self Care | Payer: Self-pay | Attending: Emergency Medicine | Admitting: Emergency Medicine

## 2016-02-20 ENCOUNTER — Emergency Department (HOSPITAL_COMMUNITY): Payer: Self-pay

## 2016-02-20 DIAGNOSIS — M79671 Pain in right foot: Secondary | ICD-10-CM | POA: Insufficient documentation

## 2016-02-20 DIAGNOSIS — X58XXXA Exposure to other specified factors, initial encounter: Secondary | ICD-10-CM | POA: Insufficient documentation

## 2016-02-20 DIAGNOSIS — Y929 Unspecified place or not applicable: Secondary | ICD-10-CM | POA: Insufficient documentation

## 2016-02-20 DIAGNOSIS — Y939 Activity, unspecified: Secondary | ICD-10-CM | POA: Insufficient documentation

## 2016-02-20 DIAGNOSIS — Y990 Civilian activity done for income or pay: Secondary | ICD-10-CM | POA: Insufficient documentation

## 2016-02-20 MED ORDER — ACETAMINOPHEN 325 MG PO TABS
975.0000 mg | ORAL_TABLET | Freq: Once | ORAL | Status: AC
  Administered 2016-02-20: 975 mg via ORAL
  Filled 2016-02-20: qty 3

## 2016-02-20 MED ORDER — IBUPROFEN 800 MG PO TABS
800.0000 mg | ORAL_TABLET | Freq: Once | ORAL | Status: AC
  Administered 2016-02-20: 800 mg via ORAL
  Filled 2016-02-20: qty 1

## 2016-02-20 NOTE — ED Provider Notes (Signed)
CSN: [id number]     Arrival date & time 02/20/16  0857 History   First MD Initiated Contact with Patient 02/20/16 [phone]     Chief Complaint  Patient presents with  . Foot Pain     (Consider location/radiation/quality/duration/timing/severity/associated sxs/prior Treatment) HPI   Blood pressure 142/109, pulse 79, temperature 98 F (36.7 C), temperature source Oral, resp. rate 17, SpO2 100 %.  Brian Erickson is a 50 y.o. male complaining of atraumatic pain to great toe of right foot worsening over the course of 2 days. Patient states that he wears rubber boots at work, he put an insole and numb but they are very uncomfortable when he walks frequently.  Pain initially started in the left footed but is now significantly worsen the right, states pain is exacerbated by weightbearing. No pain medication taken prior to arrival.  History reviewed. No pertinent past medical history. History reviewed. No pertinent past surgical history. No family history on file. Social History  Substance Use Topics  . Smoking status: Never Smoker   . Smokeless tobacco: None  . Alcohol Use: No     Comment: socially     Review of Systems  10 systems reviewed and found to be negative, except as noted in the HPI.   Allergies  Review of patient's allergies indicates no known allergies.  Home Medications   Prior to Admission medications   Medication Sig Start Date End Date Taking? Authorizing Provider  ibuprofen (ADVIL,MOTRIN) 600 MG tablet Take 1 tablet (600 mg total) by mouth every 6 (six) hours as needed. 09/13/15   Shon Batonourtney F Horton, MD  oxyCODONE-acetaminophen (PERCOCET/ROXICET) 5-325 MG tablet Take 1-2 tablets by mouth every 6 (six) hours as needed for severe pain. 09/13/15   Shon Batonourtney F Horton, MD   BP 140/91 mmHg  Pulse 70  Temp(Src) 97.7 F (36.5 C) (Oral)  Resp 16  SpO2 100% Physical Exam  Constitutional: He is oriented to person, place, and time. He appears well-developed and  well-nourished. No distress.  HENT:  Head: Normocephalic and atraumatic.  Eyes: Conjunctivae and EOM are normal.  Cardiovascular: Normal rate, regular rhythm and intact distal pulses.   Pulmonary/Chest: Effort normal and breath sounds normal. No stridor. No respiratory distress. He has no wheezes. He has no rales. He exhibits no tenderness.  Abdominal: Soft. Bowel sounds are normal. He exhibits no distension and no mass. There is no tenderness. There is no rebound and no guarding.  Musculoskeletal: Normal range of motion. He exhibits tenderness. He exhibits no edema.  Right foot with no deformity or edema, no overlying skin changes, excellent range of motion to toes, DP and PT pulses are 2+. Tender to palpation along the first MTP without warmth or swelling.  Neurological: He is alert and oriented to person, place, and time.  Psychiatric: He has a normal mood and affect.  Nursing note and vitals reviewed.   ED Course  Procedures (including critical care time) Labs Review Labs Reviewed - No data to display  Imaging Review Dg Foot Complete Right  02/20/2016  CLINICAL DATA:  Worsening right foot pain at the great toe. No known trauma. EXAM: RIGHT FOOT COMPLETE - 3+ VIEW COMPARISON:  09/13/2015 FINDINGS: No fracture deformity, subluxation, or erosive changes. No gouty calcification. IMPRESSION: Negative. Electronically Signed   By: Marnee SpringJonathon  Watts M.D.   On: 02/20/2016 09:58   I have personally reviewed and evaluated these images and lab results as part of my medical decision-making.   EKG Interpretation None  MDM   Final diagnoses:  Foot pain, right    Filed Vitals:   02/20/16 0916 02/20/16 1052  BP: 142/109 140/91  Pulse: 79 70  Temp: 98 F (36.7 C) 97.7 F (36.5 C)  TempSrc: Oral Oral  Resp: 17 16  SpO2: 100% 100%    Medications  ibuprofen (ADVIL,MOTRIN) tablet 800 mg (800 mg Oral Given 02/20/16 1029)  acetaminophen (TYLENOL) tablet 975 mg (975 mg Oral Given  02/20/16 1029)    Brian Erickson is 50 y.o. male presenting with Atraumatic right great toe pain. Physical exam is not consistent with gout, there is no overlying skin changes, no warmth. Excised raise negative. I think this may be related to his uncomfortable rubber boots he has to wear for work, I have given him a referral to podiatry, patient is given crutches in a postop shoe.  Evaluation does not show pathology that would require ongoing emergent intervention or inpatient treatment. Pt is hemodynamically stable and mentating appropriately. Discussed findings and plan with patient/guardian, who agrees with care plan. All questions answered. Return precautions discussed and outpatient follow up given.      Wynetta Emeryicole Rigley Niess, PA-C 02/20/16 1606  Tilden FossaElizabeth Rees, MD 02/20/16 [phone]

## 2016-02-20 NOTE — ED Notes (Addendum)
Pt states he understands and demonstrates crutch walking. No additional teaching required

## 2016-02-20 NOTE — ED Notes (Signed)
Crutches with pt- post op shoe intact to rt foot- good CMS

## 2016-02-20 NOTE — ED Notes (Signed)
Ace wrap applied to rt foot per discharge instructions

## 2016-02-20 NOTE — ED Notes (Signed)
Pt c/o R foot pain. Denies injury. Denies swelling or redness. C/o tenderness to palpation. Pt wears rubber boots and does a lot of walking at work. Pt sts "It actually started with my Left foot and then moved to my R foot Saturday night." A&Ox4 and ambulatory.

## 2016-02-20 NOTE — Discharge Instructions (Signed)
Rest, Ice intermittently (in the first 24-48 hours), Gentle compression with an Ace wrap, and elevate (Limb above the level of the heart)   Take up to 800mg  of ibuprofen (that is usually 4 over the counter pills)  3 times a day for 5 days. Take with food.  Take acetaminophen (Tylenol) up to 975 mg (this is normally 3 over-the-counter pills) up to 3 times a day. Do not drink alcohol. Make sure your other medications do not contain acetaminophen (Read the labels!)  Do not hesitate to return to the emergency room for any new, worsening or concerning symptoms.  Please obtain primary care using resource guide below. Let them know that you were seen in the emergency room and that they will need to obtain records for further outpatient management.

## 2016-03-09 ENCOUNTER — Encounter (HOSPITAL_COMMUNITY): Payer: Self-pay | Admitting: *Deleted

## 2016-03-09 ENCOUNTER — Emergency Department (HOSPITAL_COMMUNITY)
Admission: EM | Admit: 2016-03-09 | Discharge: 2016-03-09 | Disposition: A | Discharge status: Home / Self Care | Payer: Medicaid Other | Attending: Emergency Medicine | Admitting: Emergency Medicine

## 2016-03-09 DIAGNOSIS — M79674 Pain in right toe(s): Secondary | ICD-10-CM

## 2016-03-09 DIAGNOSIS — M25571 Pain in right ankle and joints of right foot: Secondary | ICD-10-CM | POA: Insufficient documentation

## 2016-03-09 MED ORDER — IBUPROFEN 600 MG PO TABS: 600.0000 mg | ORAL_TABLET | Freq: Four times a day (QID) | ORAL | Status: DC | PRN

## 2016-03-09 NOTE — ED Notes (Signed)
Pt here for reevaluation of continued pain in great toe.

## 2016-03-09 NOTE — ED Provider Notes (Signed)
CSN: [id number]     Arrival date & time 03/09/16  1628 History  By signing my name below, I, Bridgette HabermannMaria Tan, attest that this documentation has been prepared under the direction and in the presence of General MillsBenjamin Shantrice Rodenberg, PA-C. Electronically Signed: Bridgette HabermannMaria Tan, ED Scribe. 03/09/2016. 5:22 PM.   Chief Complaint  Patient presents with  . Foot Pain   The history is provided by the patient. No language interpreter was used.   HPI Comments: Brian Erickson is a 50 y.o. male who presents to the Emergency Department complaining of gradually worsening, constant pain to great toe of right foot onset three weeks ago. [facility], had x-ray done and was given a boot and told to elevate his leg and take Ibuprofen and pt states it has not helped. Pt states that pain is exacerbated when ambulating and when he has his shoes on. Patient also admits that he typically wears a size 10, but has been wearing size 14 rubber boots at work. Pt has taken Ibuprofen with moderate relief. Pt has h/o gout and states he has been watching what he eats to avoid a flare-up. States this is not similar to gout.   Pt denies trauma to his toe. He admits he has not followed up with podiatry and has not elevated foot as much as he should. Pt denies fever, chest pain, ankle swelling, shortness of breath, numbness or weakness.    History reviewed. No pertinent past medical history. History reviewed. No pertinent past surgical history. No family history on file. Social History  Substance Use Topics  . Smoking status: Never Smoker   . Smokeless tobacco: None  . Alcohol Use: No     Comment: socially     Review of Systems A complete 10 system review of systems was obtained and all systems are negative except as noted in the HPI and PMH.   Allergies  Review of patient's allergies indicates no known allergies.  Home Medications   Prior to Admission medications   Medication Sig Start Date End Date Taking? Authorizing  Provider  ibuprofen (ADVIL,MOTRIN) 600 MG tablet Take 1 tablet (600 mg total) by mouth every 6 (six) hours as needed. 09/13/15   Shon Batonourtney F Horton, MD  oxyCODONE-acetaminophen (PERCOCET/ROXICET) 5-325 MG tablet Take 1-2 tablets by mouth every 6 (six) hours as needed for severe pain. 09/13/15   Shon Batonourtney F Horton, MD   BP 150/79 mmHg  Pulse 84  Temp(Src) 98.9 F (37.2 C) (Oral)  Resp 14  SpO2 99% Physical Exam  Constitutional: He appears well-developed and well-nourished.  Awake, alert, nontoxic appearance.  HENT:  Head: Normocephalic and atraumatic.  Eyes: Conjunctivae are normal. Right eye exhibits no discharge. Left eye exhibits no discharge.  Neck: Neck supple.  Cardiovascular: Normal rate.   Pulmonary/Chest: Effort normal. No respiratory distress. He exhibits no tenderness.  Abdominal: Soft. He exhibits no distension. There is no tenderness. There is no rebound.  Musculoskeletal: Normal range of motion. He exhibits tenderness.  Baseline ROM, no obvious new focal weakness. Mild tenderness on dorsal aspect of right IP joint of great toe. No erythema, obvious deformity. Maintains full active range of motion. No warmth. Distal pulses intact with brisk cap refill. Pain is replicated with great toe extension.  Neurological: He is alert.  Mental status and motor strength appears baseline for patient and situation. Sensation intact to light touch  Skin: Skin is warm and dry. No rash noted.  Psychiatric: He has a normal mood  and affect. His behavior is normal.  Nursing note and vitals reviewed.   ED Course  Procedures  DIAGNOSTIC STUDIES: Oxygen Saturation is 99% on RA, normal by my interpretation.    COORDINATION OF CARE: 5:20 PM Discussed treatment plan with pt at bedside and pt agreed to plan.  Labs Review Labs Reviewed - No data to display  Imaging Review No results found. I have personally reviewed and evaluated these images and lab results as part of my medical  decision-making.   EKG Interpretation None      MDM  Patient with right toe pain. Not consistent with gout, septic arthritis, hemarthrosis or other emergent pathology. Discomfort is replicated with extension of great toe, possibly secondary to tendinitis. Also encouraged patient to wear appropriate footwear at work. Discussed ibuprofen, rest, ice, elevation and follow-up with podiatry. He verbalizes understanding, agrees with plan and subsequent discharge. Voices no other questions or concerns. He understands to return for any new or worsening symptoms. Final diagnoses:  Toe pain, right    I personally performed the services described in this documentation, which was scribed in my presence. The recorded information has been reviewed and is accurate.    Joycie Peek, PA-C 03/09/16 1753  Bethann Berkshire, MD 03/09/16 [phone]

## 2016-03-09 NOTE — Discharge Instructions (Signed)
Take your medications as prescribed. Do not take any additional ibuprofen with this medication. Keep your leg elevated as we discussed. Follow up with podiatry next week for reevaluation. Return to ED for new or worsening symptoms as we discussed.  Musculoskeletal Pain Musculoskeletal pain is muscle and boney aches and pains. These pains can occur in any part of the body. Your caregiver may treat you without knowing the cause of the pain. They may treat you if blood or urine tests, X-rays, and other tests were normal.  CAUSES There is often not a definite cause or reason for these pains. These pains may be caused by a type of germ (virus). The discomfort may also come from overuse. Overuse includes working out too hard when your body is not fit. Boney aches also come from weather changes. Bone is sensitive to atmospheric pressure changes. HOME CARE INSTRUCTIONS   Ask when your test results will be ready. Make sure you get your test results.  Only take over-the-counter or prescription medicines for pain, discomfort, or fever as directed by your caregiver. If you were given medications for your condition, do not drive, operate machinery or power tools, or sign legal documents for 24 hours. Do not drink alcohol. Do not take sleeping pills or other medications that may interfere with treatment.  Continue all activities unless the activities cause more pain. When the pain lessens, slowly resume normal activities. Gradually increase the intensity and duration of the activities or exercise.  During periods of severe pain, bed rest may be helpful. Lay or sit in any position that is comfortable.  Putting ice on the injured area.  Put ice in a bag.  Place a towel between your skin and the bag.  Leave the ice on for 15 to 20 minutes, 3 to 4 times a day.  Follow up with your caregiver for continued problems and no reason can be found for the pain. If the pain becomes worse or does not go away, it may be  necessary to repeat tests or do additional testing. Your caregiver may need to look further for a possible cause. SEEK IMMEDIATE MEDICAL CARE IF:  You have pain that is getting worse and is not relieved by medications.  You develop chest pain that is associated with shortness or breath, sweating, feeling sick to your stomach (nauseous), or throw up (vomit).  Your pain becomes localized to the abdomen.  You develop any new symptoms that seem different or that concern you. MAKE SURE YOU:   Understand these instructions.  Will watch your condition.  Will get help right away if you are not doing well or get worse.   This information is not intended to replace advice given to you by your health care provider. Make sure you discuss any questions you have with your health care provider.   Document Released: 08/13/2005 Document Revised: 11/05/2011 Document Reviewed: 04/17/2013 Elsevier Interactive Patient Education [employer]2016 Elsevier Inc.

## 2016-05-20 ENCOUNTER — Encounter (HOSPITAL_COMMUNITY): Payer: Self-pay | Admitting: *Deleted

## 2016-05-20 ENCOUNTER — Emergency Department (HOSPITAL_COMMUNITY)
Admission: EM | Admit: 2016-05-20 | Discharge: 2016-05-20 | Disposition: A | Discharge status: Home / Self Care | Payer: Medicaid Other | Attending: Emergency Medicine | Admitting: Emergency Medicine

## 2016-05-20 ENCOUNTER — Emergency Department (HOSPITAL_COMMUNITY): Payer: Medicaid Other

## 2016-05-20 DIAGNOSIS — J40 Bronchitis, not specified as acute or chronic: Secondary | ICD-10-CM | POA: Insufficient documentation

## 2016-05-20 MED ORDER — ALBUTEROL SULFATE HFA 108 (90 BASE) MCG/ACT IN AERS
2.0000 | INHALATION_SPRAY | RESPIRATORY_TRACT | Status: DC | PRN
  Administered 2016-05-20: 2 via RESPIRATORY_TRACT
  Filled 2016-05-20: qty 6.7

## 2016-05-20 MED ORDER — BENZONATATE 100 MG PO CAPS: 100.0000 mg | ORAL_CAPSULE | Freq: Three times a day (TID) | ORAL | 0 refills | Status: DC

## 2016-05-20 NOTE — ED Notes (Signed)
Patient went to xray 

## 2016-05-20 NOTE — ED Triage Notes (Signed)
Pt reports productive cough for several days, has brown sputum. Reports bodyaches and possible fever. Airway intact at triage.

## 2016-05-20 NOTE — ED Provider Notes (Signed)
[facility]-EMERGENCY DEPT Provider Note   CSN: [id number] Arrival date & time: 05/20/16  [id number]     History   Chief Complaint Chief Complaint  Patient presents with  . Cough  . Fever    HPI Brian Erickson is a 50 y.o. male.  Patient with history of asthma presents with complaint of cough productive of yellow sputum, occasional wheezing, mild shortness of breath for the past 1 week. Patient states that several family members have had a cold recently. Patient has had some chills, no documented fevers. He has had nasal congestion and runny nose. He started a new job recently where he is working in a very cold environment. No lower extremity swelling. No vomiting or diarrhea. No abdominal pain or chest pain. Patient is currently not on any therapy for asthma, states that he had it worse as a small child. The onset of this condition was acute. The course is constant. Aggravating factors: none. Alleviating factors: none.        History reviewed. No pertinent past medical history.  There are no active problems to display for this patient.   History reviewed. No pertinent surgical history.     Home Medications    Prior to Admission medications   Medication Sig Start Date End Date Taking? Authorizing Provider  ibuprofen (ADVIL,MOTRIN) 600 MG tablet Take 1 tablet (600 mg total) by mouth every 6 (six) hours as needed. 03/09/16   Joycie PeekBenjamin Cartner, PA-C  oxyCODONE-acetaminophen (PERCOCET/ROXICET) 5-325 MG tablet Take 1-2 tablets by mouth every 6 (six) hours as needed for severe pain. 09/13/15   Shon Batonourtney F Horton, MD    Family History History reviewed. No pertinent family history.  Social History Social History  Substance Use Topics  . Smoking status: Never Smoker  . Smokeless tobacco: Not on file  . Alcohol use No     Comment: socially      Allergies   Review of patient's allergies indicates no known allergies.   Review of Systems Review of Systems  Constitutional: Positive  for chills. Negative for fatigue and fever.  HENT: Positive for congestion and rhinorrhea. Negative for ear pain, sinus pressure and sore throat.   Eyes: Negative for redness.  Respiratory: Positive for cough, shortness of breath and stridor. Negative for wheezing.   Gastrointestinal: Negative for abdominal pain, diarrhea, nausea and vomiting.  Genitourinary: Negative for dysuria.  Musculoskeletal: Negative for myalgias and neck stiffness.  Skin: Negative for rash.  Neurological: Negative for headaches.  Hematological: Negative for adenopathy.     Physical Exam Updated Vital Signs BP 117/72 (BP Location: Right Arm)   Pulse (!) 59   Temp 97.8 F (36.6 C) (Oral)   Resp 20   Ht 5' 9.5" (1.765 m)   Wt 88.5 kg   SpO2 100%   BMI 28.38 kg/m   Physical Exam  Constitutional: He appears well-developed and well-nourished.  HENT:  Head: Normocephalic and atraumatic.  Mouth/Throat: Oropharynx is clear and moist.  Eyes: Conjunctivae are normal. Right eye exhibits no discharge. Left eye exhibits no discharge.  Neck: Normal range of motion. Neck supple.  Cardiovascular: Normal rate, regular rhythm and normal heart sounds.   No murmur heard. Pulmonary/Chest: Effort normal and breath sounds normal. No respiratory distress. He has no wheezes. He has no rales.  Abdominal: Soft. There is no tenderness.  Neurological: He is alert.  Skin: Skin is warm and dry.  Psychiatric: He has a normal mood and affect.  Nursing note and vitals reviewed.  ED Treatments / Results    Radiology Dg Chest 2 View  Result Date: 05/20/2016 CLINICAL DATA:  Cough productive of yellow phlegm and congestion. Intermittent chills for 1 week. EXAM: CHEST  2 VIEW COMPARISON:  08/10/2006 FINDINGS: The heart size and mediastinal contours are within normal limits. Both lungs are clear. The visualized skeletal structures are unremarkable. IMPRESSION: Normal chest Electronically Signed   By: Tollie Eth M.D.   On:  05/20/2016 09:56    Procedures Procedures (including critical care time)  Medications Ordered in ED Medications  albuterol (PROVENTIL HFA;VENTOLIN HFA) 108 (90 Base) MCG/ACT inhaler 2 puff (not administered)     Initial Impression / Assessment and Plan / ED Course  I have reviewed the triage vital signs and the nursing notes.  Pertinent labs & imaging results that were available during my care of the patient were reviewed by me and considered in my medical decision making (see chart for details).  Clinical Course   Patient seen and examined. Work-up initiated.    Vital signs reviewed and are as follows: BP 125/77   Pulse (!) 53   Temp 97.8 F (36.6 C) (Oral)   Resp 20   Ht 5' 9.5" (1.765 m)   Wt 88.5 kg   SpO2 97%   BMI 28.38 kg/m   10:34 AM patient updated on x-ray results. Will discharge to home with albuterol inhaler, Tessalon.  Patient encouraged to return with worsening shortness of breath, persistent fever, chest pain, new symptoms or other concerns. He verbalizes understanding and agrees with plan.  Final Clinical Impressions(s) / ED Diagnoses   Final diagnoses:  Bronchitis   Patient with likely viral bronchitis. No concern for PNA given normal lung exam/x-ray. Antibiotics not indicated. Conservative therapy indicated.     New Prescriptions New Prescriptions   BENZONATATE (TESSALON) 100 MG CAPSULE    Take 1 capsule (100 mg total) by mouth every 8 (eight) hours.     Renne Crigler, PA-C 05/20/16 1035    Lyndal Pulley, MD 05/20/16 [phone]

## 2016-05-20 NOTE — ED Notes (Signed)
Patient comes in today with c/o SOB, diaphoresis, and productive cough. Started a week ago. Sputum is yellow color. Unsure if having fevers. Denies chest pain. States his family has had colds and he believes he got it from them. Hx asthma but states he does not ever use an inhaler.

## 2016-05-20 NOTE — Discharge Instructions (Signed)
Please read and follow all provided instructions.  Your diagnoses today include:  1. Bronchitis     Tests performed today include:  Chest x-ray - does not show any pneumonia  Vital signs. See below for your results today.   Medications prescribed:   Albuterol inhaler - medication that opens up your airway  Use inhaler as follows: 1-2 puffs with spacer every 4 hours as needed for wheezing, cough, or shortness of breath.    Tessalon Perles - cough suppressant medication  Take any prescribed medications only as directed.  Home care instructions:  Follow any educational materials contained in this packet.  Follow-up instructions: Please follow-up with your primary care provider in the next 3 days for further evaluation of your symptoms and a recheck if you are not feeling better.   Return instructions:   Please return to the Emergency Department if you experience worsening symptoms.  Please return with worsening wheezing, shortness of breath, or difficulty breathing.  Return with persistent fever above 101F.   Please return if you have any other emergent concerns.  Additional Information:  Your vital signs today were: BP 119/68    Pulse (!) 52    Temp 97.8 F (36.6 C) (Oral)    Resp 20    Ht 5' 9.5" (1.765 m)    Wt 88.5 kg    SpO2 99%    BMI 28.38 kg/m  If your blood pressure (BP) was elevated above 135/85 this visit, please have this repeated by your doctor within one month. --------------

## 2016-06-26 ENCOUNTER — Emergency Department (HOSPITAL_COMMUNITY): Payer: Self-pay

## 2016-06-26 ENCOUNTER — Emergency Department (HOSPITAL_COMMUNITY)
Admission: EM | Admit: 2016-06-26 | Discharge: 2016-06-26 | Disposition: A | Discharge status: Home / Self Care | Payer: Self-pay | Attending: Emergency Medicine | Admitting: Emergency Medicine

## 2016-06-26 DIAGNOSIS — Y939 Activity, unspecified: Secondary | ICD-10-CM | POA: Insufficient documentation

## 2016-06-26 DIAGNOSIS — Y999 Unspecified external cause status: Secondary | ICD-10-CM | POA: Insufficient documentation

## 2016-06-26 DIAGNOSIS — M79672 Pain in left foot: Secondary | ICD-10-CM

## 2016-06-26 DIAGNOSIS — X58XXXA Exposure to other specified factors, initial encounter: Secondary | ICD-10-CM | POA: Insufficient documentation

## 2016-06-26 DIAGNOSIS — S92902A Unspecified fracture of left foot, initial encounter for closed fracture: Secondary | ICD-10-CM | POA: Insufficient documentation

## 2016-06-26 DIAGNOSIS — Y929 Unspecified place or not applicable: Secondary | ICD-10-CM | POA: Insufficient documentation

## 2016-06-26 DIAGNOSIS — S92302A Fracture of unspecified metatarsal bone(s), left foot, initial encounter for closed fracture: Secondary | ICD-10-CM

## 2016-06-26 MED ORDER — KETOROLAC TROMETHAMINE 60 MG/2ML IM SOLN
60.0000 mg | Freq: Once | INTRAMUSCULAR | Status: AC
  Administered 2016-06-26: 60 mg via INTRAMUSCULAR
  Filled 2016-06-26: qty 2

## 2016-06-26 MED ORDER — NAPROXEN 500 MG PO TABS: 500.0000 mg | ORAL_TABLET | Freq: Two times a day (BID) | ORAL | 0 refills | Status: DC

## 2016-06-26 NOTE — ED Triage Notes (Signed)
Per pt, Pt coming coming from home with complaint of constant aching pain in left foot sole radiating to heel. Worse with weight bearing, continues to throb at rest. Pain began on Thursday, worsening last night. No Hx of MSK issues or foot pain in the past. On assessment, L foot slightly swollen, non-pitting edema. Pedal pulses +2 bilat.

## 2016-06-26 NOTE — ED Notes (Signed)
Pt taken to Xray at this time.

## 2016-06-26 NOTE — ED Provider Notes (Signed)
[facility]-EMERGENCY DEPT Provider Note   CSN: [id number] Arrival date & time: 06/26/16  [id number]     History   Chief Complaint Chief Complaint  Patient presents with  . Foot Pain    HPI Brian Erickson is a 50 y.o. male.  HPI  50 year old male presents with atraumatic left foot pain. Hurts on the outside of his left foot since 5 days ago. He has no some swelling but no redness. Has tried heat which has partially helped. Has not tried any medicines. He states he has had gout in the past and his right foot but states it was not in his toe. Feels similar. Denies any other known medical problems.  No past medical history on file.  There are no active problems to display for this patient.   No past surgical history on file.     Home Medications    Prior to Admission medications   Medication Sig Start Date End Date Taking? Authorizing Provider  benzonatate (TESSALON) 100 MG capsule Take 1 capsule (100 mg total) by mouth every 8 (eight) hours. Patient not taking: Reported on 06/26/2016 05/20/16   Renne CriglerJoshua Geiple, PA-C  ibuprofen (ADVIL,MOTRIN) 600 MG tablet Take 1 tablet (600 mg total) by mouth every 6 (six) hours as needed. Patient not taking: Reported on 06/26/2016 03/09/16   Joycie PeekBenjamin Cartner, PA-C  naproxen (NAPROSYN) 500 MG tablet Take 1 tablet (500 mg total) by mouth 2 (two) times daily. 06/26/16   Pricilla LovelessScott Arissa Fagin, MD  oxyCODONE-acetaminophen (PERCOCET/ROXICET) 5-325 MG tablet Take 1-2 tablets by mouth every 6 (six) hours as needed for severe pain. Patient not taking: Reported on 06/26/2016 09/13/15   Shon Batonourtney F Horton, MD    Family History No family history on file.  Social History Social History  Substance Use Topics  . Smoking status: Never Smoker  . Smokeless tobacco: Not on file  . Alcohol use No     Comment: socially      Allergies   Review of patient's allergies indicates no known allergies.   Review of Systems Review of Systems  Constitutional: Negative for  fever.  Musculoskeletal: Positive for arthralgias and joint swelling.  Skin: Negative for color change.  Neurological: Negative for weakness and numbness.  All other systems reviewed and are negative.    Physical Exam Updated Vital Signs BP 136/71   Pulse 64   Temp 98.2 F (36.8 C) (Oral)   Resp 18   Ht 5\' 9"  (1.753 m)   Wt 182 lb (82.6 kg)   SpO2 97%   BMI 26.88 kg/m   Physical Exam  Constitutional: He is oriented to person, place, and time. He appears well-developed and well-nourished.  HENT:  Head: Normocephalic and atraumatic.  Right Ear: External ear normal.  Left Ear: External ear normal.  Nose: Nose normal.  Eyes: Right eye exhibits no discharge. Left eye exhibits no discharge.  Neck: Neck supple.  Cardiovascular: Normal rate, regular rhythm and intact distal pulses.   Pulses:      Dorsalis pedis pulses are 2+ on the right side, and 2+ on the left side.  Pulmonary/Chest: Effort normal.  Abdominal: Soft. There is no tenderness.  Musculoskeletal: He exhibits no edema.       Left ankle: No tenderness. Achilles tendon normal. Achilles tendon exhibits no pain and no defect.       Left foot: There is tenderness and swelling. There is no deformity.       Feet:  Neurological: He is alert and oriented to person,  place, and time.  Skin: Skin is warm and dry.  Nursing note and vitals reviewed.    ED Treatments / Results  Labs (all labs ordered are listed, but only abnormal results are displayed) Labs Reviewed - No data to display  EKG  EKG Interpretation None       Radiology Dg Foot Complete Left  Result Date: 06/26/2016 CLINICAL DATA:  Lateral foot pain for 5 days, no known injury EXAM: LEFT FOOT - COMPLETE 3+ VIEW COMPARISON:  None. FINDINGS: Three views of the left foot submitted. Tiny plantar spur of calcaneus. There is tiny avulsion fracture on lateral aspect of the base of proximal phalanx fifth toe of indeterminate age. Clinical correlation is  necessary. IMPRESSION: Tiny plantar spur of calcaneus. There is tiny avulsion fracture on lateral aspect of the base of proximal phalanx fifth toe of indeterminate age. Clinical correlation is necessary. Electronically Signed   By: Natasha MeadLiviu  Pop M.D.   On: 06/26/2016 09:06    Procedures Procedures (including critical care time)  Medications Ordered in ED Medications  ketorolac (TORADOL) injection 60 mg (60 mg Intramuscular Given 06/26/16 0844)     Initial Impression / Assessment and Plan / ED Course  I have reviewed the triage vital signs and the nursing notes.  Pertinent labs & imaging results that were available during my care of the patient were reviewed by me and considered in my medical decision making (see chart for details).  Clinical Course  Comment By Time  Possibly gout, will treat with toradol, get xray Pricilla LovelessScott Marykathleen Russi, MD 10/31 [phone]    Could be gout given it is atraumatic. Treat with naproxen. Unclear if the tiny avulsion fx is related but it is in the are of pain. Post op shoe, ortho follow up, nsaids, elevation, heat. Highly doubt septic joint or infection  Final Clinical Impressions(s) / ED Diagnoses   Final diagnoses:  Left foot pain  Closed avulsion fracture of metatarsal bone of left foot, initial encounter    New Prescriptions New Prescriptions   NAPROXEN (NAPROSYN) 500 MG TABLET    Take 1 tablet (500 mg total) by mouth 2 (two) times daily.     Pricilla LovelessScott Tanyika Barros, MD 06/26/16 [id number]

## 2016-12-24 ENCOUNTER — Ambulatory Visit (INDEPENDENT_AMBULATORY_CARE_PROVIDER_SITE_OTHER): Payer: Self-pay | Admitting: Physician Assistant

## 2016-12-24 ENCOUNTER — Other Ambulatory Visit (HOSPITAL_COMMUNITY)
Admission: RE | Admit: 2016-12-24 | Discharge: 2016-12-24 | Disposition: A | Discharge status: Home / Self Care | Payer: Medicaid Other | Source: Ambulatory Visit | Attending: Physician Assistant | Admitting: Physician Assistant

## 2016-12-24 ENCOUNTER — Encounter (INDEPENDENT_AMBULATORY_CARE_PROVIDER_SITE_OTHER): Payer: Self-pay | Admitting: Physician Assistant

## 2016-12-24 VITALS — BP 122/78 | HR 65 | Temp 97.8°F | Ht 69.0 in | Wt 183.8 lb

## 2016-12-24 DIAGNOSIS — Z113 Encounter for screening for infections with a predominantly sexual mode of transmission: Secondary | ICD-10-CM | POA: Insufficient documentation

## 2016-12-24 DIAGNOSIS — Z23 Encounter for immunization: Secondary | ICD-10-CM

## 2016-12-24 DIAGNOSIS — Z1159 Encounter for screening for other viral diseases: Secondary | ICD-10-CM

## 2016-12-24 NOTE — Progress Notes (Signed)
  Subjective:  Patient ID: Brian Erickson, male    DOB: March 05, 1966  Age: 51 y.o. MRN: [id number]  CC: annual physical  HPI Brian Erickson is a 51 y.o. male with no significant PMH presents with concern for STD.  Had sexual relations with a woman two times. Broke up with the male. She called after the breakup and told him that she has an STD. Pt states he used condoms during their sexual encounters. Pt denies penile discharge, dysuria, genital lesions, testicular pain, testicular swelling, rash, f/c/n/v, abdominal pain. Does not endorse any other symptoms.    Review of Systems  Constitutional: Negative for chills, fever and malaise/fatigue.  Eyes: Negative for blurred vision.  Respiratory: Negative for shortness of breath.   Cardiovascular: Negative for chest pain and palpitations.  Gastrointestinal: Negative for abdominal pain and nausea.  Genitourinary: Negative for dysuria and hematuria.  Musculoskeletal: Negative for joint pain and myalgias.  Skin: Negative for rash.  Neurological: Negative for tingling and headaches.  Psychiatric/Behavioral: Negative for depression. The patient is not nervous/anxious.     Objective:  BP 122/78   Pulse 65   Temp 97.8 F (36.6 C) (Oral)   Ht  (1.753 m)   Wt 183 lb 12.8 oz (83.4 kg)   SpO2 98%   BMI 27.14 kg/m   BP/Weight 12/24/2016 06/26/2016 05/20/2016  Systolic BP 122 133 119  Diastolic BP 78 74 68  Wt. (Lbs) 183.8 182 195  BMI 27.14 26.88 28.38      Physical Exam  Constitutional: He is oriented to person, place, and time.  Well developed, well nourished, NAD, polite  HENT:  Head: Normocephalic and atraumatic.  Poor dentition  Eyes: No scleral icterus.  Neck: Normal range of motion.  Cardiovascular: Normal rate, regular rhythm and normal heart sounds.   Pulmonary/Chest: Effort normal and breath sounds normal.  Abdominal: Soft. Bowel sounds are normal. There is no tenderness.  Genitourinary:  Genitourinary Comments: No  balanitis or discharge. No testicular swelling or tenderness. No genital lesions.  Neurological: He is alert and oriented to person, place, and time.  Skin: Skin is warm and dry. No rash noted. No erythema. No pallor.  Psychiatric: He has a normal mood and affect. His behavior is normal. Thought content normal.  Vitals reviewed.    Assessment & Plan:   1. Screening examination for STD (sexually transmitted disease) - HIV antibody - RPR - HSV(herpes simplex vrs) 1+2 ab-IgG - Urine cytology ancillary only  2. Need for hepatitis C screening test - Hepatitis c antibody (reflex)  3. Need for Tdap vaccination - Tdap vaccine greater than or equal to 7yo IM   No orders of the defined types were placed in this encounter.   Follow-up: Return in about 2 weeks (around 01/07/2017) for full physical.   Loletta Specter PA

## 2016-12-24 NOTE — Patient Instructions (Signed)
Sexually Transmitted Disease A sexually transmitted disease (STD) is a disease or infection that may be passed (transmitted) from person to person, usually during sexual activity. This may happen by way of saliva, semen, blood, vaginal mucus, or urine. Common STDs include:  Gonorrhea.  Chlamydia.  Syphilis.  HIV and AIDS.  Genital herpes.  Hepatitis B and C.  Trichomonas.  Human papillomavirus (HPV).  Pubic lice.  Scabies.  Mites.  Bacterial vaginosis. What are the causes? An STD may be caused by bacteria, a virus, or parasites. STDs are often transmitted during sexual activity if one person is infected. However, they may also be transmitted through nonsexual means. STDs may be transmitted after:  Sexual intercourse with an infected person.  Sharing sex toys with an infected person.  Sharing needles with an infected person or using unclean piercing or tattoo needles.  Having intimate contact with the genitals, mouth, or rectal areas of an infected person.  Exposure to infected fluids during birth. What are the signs or symptoms? Different STDs have different symptoms. Some people may not have any symptoms. If symptoms are present, they may include:  Painful or bloody urination.  Pain in the pelvis, abdomen, vagina, anus, throat, or eyes.  A skin rash, itching, or irritation.  Growths, ulcerations, blisters, or sores in the genital and anal areas.  Abnormal vaginal discharge with or without bad odor.  Penile discharge in men.  Fever.  Pain or bleeding during sexual intercourse.  Swollen glands in the groin area.  Yellow skin and eyes (jaundice). This is seen with hepatitis.  Swollen testicles.  Infertility.  Sores and blisters in the mouth. How is this diagnosed? To make a diagnosis, your health care provider may:  Take a medical history.  Perform a physical exam.  Take a sample of any discharge to examine.  Swab the throat, cervix, opening to  the penis, rectum, or vagina for testing.  Test a sample of your first morning urine.  Perform blood tests.  Perform a Pap test, if this applies.  Perform a colposcopy.  Perform a laparoscopy. How is this treated? Treatment depends on the STD. Some STDs may be treated but not cured.  Chlamydia, gonorrhea, trichomonas, and syphilis can be cured with antibiotic medicine.  Genital herpes, hepatitis, and HIV can be treated, but not cured, with prescribed medicines. The medicines lessen symptoms.  Genital warts from HPV can be treated with medicine or by freezing, burning (electrocautery), or surgery. Warts may come back.  HPV cannot be cured with medicine or surgery. However, abnormal areas may be removed from the cervix, vagina, or vulva.  If your diagnosis is confirmed, your recent sexual partners need treatment. This is true even if they are symptom-free or have a negative culture or evaluation. They should not have sex until their health care providers say it is okay.  Your health care provider may test you for infection again 3 months after treatment. How is this prevented? Take these steps to reduce your risk of getting an STD:  Use latex condoms, dental dams, and water-soluble lubricants during sexual activity. Do not use petroleum jelly or oils.  Avoid having multiple sex partners.  Do not have sex with someone who has other sex partners.  Do not have sex with anyone you do not know or who is at high risk for an STD.  Avoid risky sex practices that can break your skin.  Do not have sex if you have open sores on your mouth or skin.    Avoid drinking too much alcohol or taking illegal drugs. Alcohol and drugs can affect your judgment and put you in a vulnerable position.  Avoid engaging in oral and anal sex acts.  Get vaccinated for HPV and hepatitis. If you have not received these vaccines in the past, talk to your health care provider about whether one or both might be  right for you.  If you are at risk of being infected with HIV, it is recommended that you take a prescription medicine daily to prevent HIV infection. This is called pre-exposure prophylaxis (PrEP). You are considered at risk if:  You are a man who has sex with other men (MSM).  You are a heterosexual man or woman and are sexually active with more than one partner.  You take drugs by injection.  You are sexually active with a partner who has HIV.  Talk with your health care provider about whether you are at high risk of being infected with HIV. If you choose to begin PrEP, you should first be tested for HIV. You should then be tested every 3 months for as long as you are taking PrEP. Contact a health care provider if:  See your health care provider.  Tell your sexual partner(s). They should be tested and treated for any STDs.  Do not have sex until your health care provider says it is okay. Get help right away if: Contact your health care provider right away if:  You have severe abdominal pain.  You are a man and notice swelling or pain in your testicles.  You are a woman and notice swelling or pain in your vagina. This information is not intended to replace advice given to you by your health care provider. Make sure you discuss any questions you have with your health care provider. Document Released: 11/03/2002 Document Revised: 03/02/2016 Document Reviewed: 03/03/2013 Elsevier Interactive Patient Education  2017 [employer]  

## 2016-12-25 LAB — HSV(HERPES SIMPLEX VRS) I + II AB-IGG
HSV 1 GLYCOPROTEIN G AB, IGG: 14.7 {index} — AB (ref 0.00–0.90)
HSV 2 GLYCOPROTEIN G AB, IGG: 3.24 {index} — AB (ref 0.00–0.90)

## 2016-12-25 LAB — RPR: RPR Ser Ql: NONREACTIVE

## 2016-12-25 LAB — URINE CYTOLOGY ANCILLARY ONLY
CHLAMYDIA, DNA PROBE: NEGATIVE
Neisseria Gonorrhea: NEGATIVE
TRICH (WINDOWPATH): NEGATIVE

## 2016-12-25 LAB — HIV ANTIBODY (ROUTINE TESTING W REFLEX): HIV Screen 4th Generation wRfx: NONREACTIVE

## 2016-12-25 LAB — HEPATITIS C ANTIBODY (REFLEX)

## 2016-12-25 LAB — HCV COMMENT:

## 2017-04-18 ENCOUNTER — Encounter (HOSPITAL_COMMUNITY): Payer: Self-pay

## 2017-04-18 ENCOUNTER — Emergency Department (HOSPITAL_COMMUNITY)
Admission: EM | Admit: 2017-04-18 | Discharge: 2017-04-18 | Disposition: A | Discharge status: Home / Self Care | Payer: Medicaid Other | Attending: Emergency Medicine | Admitting: Emergency Medicine

## 2017-04-18 DIAGNOSIS — L720 Epidermal cyst: Secondary | ICD-10-CM | POA: Insufficient documentation

## 2017-04-18 DIAGNOSIS — L089 Local infection of the skin and subcutaneous tissue, unspecified: Secondary | ICD-10-CM | POA: Insufficient documentation

## 2017-04-18 MED ORDER — TETANUS-DIPHTH-ACELL PERTUSSIS 5-2.5-18.5 LF-MCG/0.5 IM SUSP
0.5000 mL | Freq: Once | INTRAMUSCULAR | Status: AC
  Administered 2017-04-18: 0.5 mL via INTRAMUSCULAR
  Filled 2017-04-18: qty 0.5

## 2017-04-18 MED ORDER — LIDOCAINE-EPINEPHRINE 2 %-1:100000 IJ SOLN: 20.0000 mL | Freq: Once | INTRAMUSCULAR | Status: DC

## 2017-04-18 MED ORDER — LIDOCAINE-EPINEPHRINE (PF) 2 %-1:200000 IJ SOLN
10.0000 mL | Freq: Once | INTRAMUSCULAR | Status: DC
  Filled 2017-04-18: qty 20

## 2017-04-18 MED ORDER — DOXYCYCLINE HYCLATE 100 MG PO CAPS: 100.0000 mg | ORAL_CAPSULE | Freq: Two times a day (BID) | ORAL | 0 refills | Status: DC

## 2017-04-18 NOTE — ED Notes (Signed)
Declined W/C at D/C and was escorted to lobby by RN. 

## 2017-04-18 NOTE — ED Provider Notes (Signed)
[facility]Y DEPT Provider Note   CSN: [id number] Arrival date & time: 04/18/17  9381     History   Chief Complaint Chief Complaint  Patient presents with  . Insect Bite    HPI Brian Erickson is a 51 y.o. male.  HPI   51 year old male presenting with suspect insect bite. Patient notice pain to his right upper back that started yesterday. He reached behind and felt a bump on his back which is new. Report mild itchiness but mostly sharp pain worse with palpation to the affected area. Nothing seems to make it better or worse. Denies seeing any insect that bit him. Denies having fever, chills, trouble breathing. He is not up-to-date with tetanus.  History reviewed. No pertinent past medical history.  Patient Active Problem List   Diagnosis Date Noted  . Screening examination for STD (sexually transmitted disease) 12/24/2016    History reviewed. No pertinent surgical history.     Home Medications    Prior to Admission medications   Not on File    Family History No family history on file.  Social History Social History  Substance Use Topics  . Smoking status: Never Smoker  . Smokeless tobacco: Never Used  . Alcohol use No     Comment: socially      Allergies   Patient has no known allergies.   Review of Systems Review of Systems  Constitutional: Negative for fever.  Skin: Negative for rash.  Neurological: Negative for numbness.     Physical Exam Updated Vital Signs BP (!) 158/95 (BP Location: Left Arm)   Pulse 62   Temp (!) 97.5 F (36.4 C) (Oral)   Resp 20   SpO2 100%   Physical Exam  Constitutional: He appears well-developed and well-nourished. No distress.  HENT:  Head: Atraumatic.  Eyes: Conjunctivae are normal.  Neck: Neck supple.  Neurological: He is alert.  Skin: No rash noted.  Back: On patient's right upper posterior back there is a subcutaneous mobile nodule approximately 2 cm in diameter that is mildly tender to palpation but  no surrounding skin erythema.  Psychiatric: He has a normal mood and affect.  Nursing note and vitals reviewed.    ED Treatments / Results  Labs (all labs ordered are listed, but only abnormal results are displayed) Labs Reviewed - No data to display  EKG  EKG Interpretation None       Radiology No results found.  Procedures .Marland KitchenIncision and Drainage Date/Time: 04/18/2017 11:05 AM Performed by: Fayrene Helper Authorized by: Fayrene Helper   Consent:    Consent obtained:  Verbal   Consent given by:  Patient   Risks discussed:  Infection and pain   Alternatives discussed:  No treatment and referral Location:    Type:  Abscess   Size:  2cm   Location:  Trunk   Trunk location:  Back Pre-procedure details:    Skin preparation:  Betadine Anesthesia (see MAR for exact dosages):    Anesthesia method:  Local infiltration   Local anesthetic:  Lidocaine 2% WITH epi Procedure type:    Complexity:  Simple Procedure details:    Needle aspiration: no     Incision types:  Stab incision   Incision depth:  Subcutaneous   Scalpel blade:  11   Wound management:  Probed and deloculated   Drainage:  Purulent   Drainage amount:  Scant   Wound treatment:  Wound left open   Packing materials:  None Post-procedure details:    Patient  tolerance of procedure:  Tolerated well, no immediate complications   (including critical care time)  Medications Ordered in ED Medications - No data to display   Initial Impression / Assessment and Plan / ED Course  I have reviewed the triage vital signs and the nursing notes.  Pertinent labs & imaging results that were available during my care of the patient were reviewed by me and considered in my medical decision making (see chart for details).     BP (!) 158/95 (BP Location: Left Arm)   Pulse 62   Temp (!) 97.5 F (36.4 C) (Oral)   Resp 20   SpO2 100%    Final Clinical Impressions(s) / ED Diagnoses   Final diagnoses:  Infected  epidermoid cyst    New Prescriptions New Prescriptions   DOXYCYCLINE (VIBRAMYCIN) 100 MG CAPSULE    Take 1 capsule (100 mg total) by mouth 2 (two) times daily. One po bid x 7 days   10:30 AM Pt with a mobile subcutaneous nodule on his R upper back consistent with an epidermal cyst.  Due to pain, plan to perform I&D. Will update tdap.   Fayrene Helper, PA-C 04/18/17 1106    Benjiman Core, MD 04/18/17 [phone]

## 2017-04-18 NOTE — ED Triage Notes (Signed)
Pt presents for evaluation of possible insect bite to R lateral upper back. Pt reports first noticed bump yesterday and pain worsened through night, unable to sleep due to pain. Area is raised and hard, painful to touch. No redness noted. Pt denies injury, unable to remember being bitten.

## 2017-08-14 ENCOUNTER — Encounter (HOSPITAL_COMMUNITY): Payer: Self-pay | Admitting: Emergency Medicine

## 2017-08-14 ENCOUNTER — Other Ambulatory Visit: Payer: Self-pay

## 2017-08-14 ENCOUNTER — Emergency Department (HOSPITAL_COMMUNITY)
Admission: EM | Admit: 2017-08-14 | Discharge: 2017-08-14 | Disposition: A | Discharge status: Home / Self Care | Payer: Self-pay | Attending: Emergency Medicine | Admitting: Emergency Medicine

## 2017-08-14 DIAGNOSIS — M109 Gout, unspecified: Secondary | ICD-10-CM | POA: Insufficient documentation

## 2017-08-14 DIAGNOSIS — M79671 Pain in right foot: Secondary | ICD-10-CM

## 2017-08-14 MED ORDER — NAPROXEN 500 MG PO TABS: 500.0000 mg | ORAL_TABLET | Freq: Two times a day (BID) | ORAL | 0 refills | Status: DC

## 2017-08-14 NOTE — ED Notes (Signed)
Pt states hx of gout.  Pain began this morning.  Hurts to put shoe on or walk on right foot.  No swelling or deformity noted.

## 2017-08-14 NOTE — Discharge Instructions (Signed)
Please read attached information. If you experience any new or worsening signs or symptoms please return to the emergency room for evaluation. Please follow-up with your primary care provider or specialist as discussed. Please use medication prescribed only as directed and discontinue taking if you have any concerning signs or symptoms.   °

## 2017-08-14 NOTE — ED Provider Notes (Signed)
[facility] Provider Note   CSN: [id number] Arrival date & time: 08/14/17  1419     History   Chief Complaint Chief Complaint  Patient presents with  . Gout    HPI Brian Erickson is a 51 y.o. male.  HPI   51 year old male presents today with complaints of gout.  Patient notes that he was seen last year with pain to his left foot.  He reports he was diagnosed with gout started on naproxen and had immediate relief of his symptoms.  He notes this pain in his right foot feels similar to previous, denies any trauma, swelling or edema.  Patient reports painful ambulation.  Patient denies fevers.    History reviewed. No pertinent past medical history.  Patient Active Problem List   Diagnosis Date Noted  . Screening examination for STD (sexually transmitted disease) 12/24/2016    History reviewed. No pertinent surgical history.     Home Medications    Prior to Admission medications   Medication Sig Start Date End Date Taking? Authorizing Provider  doxycycline (VIBRAMYCIN) 100 MG capsule Take 1 capsule (100 mg total) by mouth 2 (two) times daily. One po bid x 7 days Patient not taking: Reported on 08/14/2017 04/18/17   Fayrene Helperran, Bowie, PA-C  naproxen (NAPROSYN) 500 MG tablet Take 1 tablet (500 mg total) by mouth 2 (two) times daily. 08/14/17   Eyvonne MechanicHedges, Bane Hagy, PA-C    Family History No family history on file.  Social History Social History   Tobacco Use  . Smoking status: Never Smoker  . Smokeless tobacco: Never Used  Substance Use Topics  . Alcohol use: No    Comment: socially   . Drug use: No     Allergies   Patient has no known allergies.   Review of Systems Review of Systems  All other systems reviewed and are negative.    Physical Exam Updated Vital Signs BP (!) 135/98   Pulse 95   Temp 98.7 F (37.1 C) (Oral)   Resp 16   Ht 5\' 9"  (1.753 m)   Wt 74.8 kg (165 lb)   SpO2 100%   BMI 24.37 kg/m    Physical Exam  Constitutional: He is oriented to person, place, and time. He appears well-developed and well-nourished.  HENT:  Head: Normocephalic and atraumatic.  Eyes: Conjunctivae are normal. Pupils are equal, round, and reactive to light. Right eye exhibits no discharge. Left eye exhibits no discharge. No scleral icterus.  Neck: Normal range of motion. No JVD present. No tracheal deviation present.  Pulmonary/Chest: Effort normal. No stridor.  Musculoskeletal:  Right foot atraumatic no swelling or edema, no warmth to touch, as was a tenderness palpation of the dorsum of the foot  Neurological: He is alert and oriented to person, place, and time. Coordination normal.  Psychiatric: He has a normal mood and affect. His behavior is normal. Judgment and thought content normal.  Nursing note and vitals reviewed.    ED Treatments / Results  Labs (all labs ordered are listed, but only abnormal results are displayed) Labs Reviewed - No data to display  EKG  EKG Interpretation None       Radiology No results found.  Procedures Procedures (including critical care time)  Medications Ordered in ED Medications - No data to display   Initial Impression / Assessment and Plan / ED Course  I have reviewed the triage vital signs and the nursing notes.  Pertinent labs & imaging results that  were available during my care of the patient were reviewed by me and considered in my medical decision making (see chart for details).    ;Labs:   Imaging:  Consults:  Therapeutics:  Discharge Meds: Naproxen  Assessment/Plan: 51 year old male presents today with complaints of atraumatic foot pain.  He has no signs of trauma or infection in that right foot.  He reports this feels similar to episode of gout patient without any trauma here today.  He is requesting naproxen as this is helped in the past.  I find this completely reasonable whether this is gout or musculoskeletal pain.  Low  suspicion for fracture.  No signs of infectious etiology.  Strict return precautions given.  Patient verbalized understanding and agreement    Final Clinical Impressions(s) / ED Diagnoses   Final diagnoses:  Right foot pain    ED Discharge Orders        Ordered    naproxen (NAPROSYN) 500 MG tablet  2 times daily     08/14/17 1719       Eyvonne MechanicHedges, Jozy Mcphearson, PA-C 08/14/17 1934    Lavera GuiseLiu, Dana Duo, MD 08/14/17 [phone]

## 2017-08-14 NOTE — ED Triage Notes (Signed)
Pt reports R foot pain, hx gout in same foot. Pt reports rx in past for colchicine, no refill.  No redness, swelling noted.

## 2017-09-11 ENCOUNTER — Emergency Department (HOSPITAL_COMMUNITY)
Admission: EM | Admit: 2017-09-11 | Discharge: 2017-09-11 | Disposition: A | Discharge status: Home / Self Care | Payer: Self-pay | Attending: Emergency Medicine | Admitting: Emergency Medicine

## 2017-09-11 ENCOUNTER — Other Ambulatory Visit: Payer: Self-pay

## 2017-09-11 ENCOUNTER — Encounter (HOSPITAL_COMMUNITY): Payer: Self-pay

## 2017-09-11 DIAGNOSIS — M109 Gout, unspecified: Secondary | ICD-10-CM | POA: Insufficient documentation

## 2017-09-11 DIAGNOSIS — Z79899 Other long term (current) drug therapy: Secondary | ICD-10-CM | POA: Insufficient documentation

## 2017-09-11 HISTORY — DX: Gout, unspecified: M10.9

## 2017-09-11 MED ORDER — NAPROXEN 500 MG PO TABS: 500.0000 mg | ORAL_TABLET | Freq: Two times a day (BID) | ORAL | 0 refills | Status: DC

## 2017-09-11 NOTE — ED Triage Notes (Signed)
Per Pt, Pt is coming from home with complaints of right foot pain and hx of gout. Out of medication. Can't see Md. Needs a work note.

## 2017-09-11 NOTE — ED Provider Notes (Signed)
[facility] EMERGENCY DEPARTMENT Provider Note   CSN: [id number] Arrival date & time: 09/11/17  1137     History   Chief Complaint Chief Complaint  Patient presents with  . Foot Pain    HPI Brian Erickson is a 52 y.o. male with a hx of gout who presents to the ED with complaint of R foot pain that started last night. States that the pain has been constant since onset, rates the pain an 8/10 in severity, worse with trying to wear a shoe. Tried Tylenol without any relief. States hx of same with gout diagnosis with improvement with Naproxen. Denies numbness, weakness, fever chills, or recent surgical procedures.   HPI  Past Medical History:  Diagnosis Date  . Gout     Patient Active Problem List   Diagnosis Date Noted  . Screening examination for STD (sexually transmitted disease) 12/24/2016    History reviewed. No pertinent surgical history.     Home Medications    Prior to Admission medications   Medication Sig Start Date End Date Taking? Authorizing Provider  doxycycline (VIBRAMYCIN) 100 MG capsule Take 1 capsule (100 mg total) by mouth 2 (two) times daily. One po bid x 7 days Patient not taking: Reported on 08/14/2017 04/18/17   Fayrene Helper, PA-C  naproxen (NAPROSYN) 500 MG tablet Take 1 tablet (500 mg total) by mouth 2 (two) times daily. 08/14/17   Eyvonne Mechanic, PA-C    Family History History reviewed. No pertinent family history.  Social History Social History   Tobacco Use  . Smoking status: Never Smoker  . Smokeless tobacco: Never Used  Substance Use Topics  . Alcohol use: No    Comment: socially   . Drug use: No     Allergies   Patient has no known allergies.   Review of Systems Review of Systems  Constitutional: Negative for chills and fever.  Musculoskeletal:       Positive for R foot pain  Neurological: Negative for weakness and numbness.    Physical Exam Updated Vital Signs BP (!) 152/81 (BP Location: Right  Arm)   Pulse 71   Temp 98.1 F (36.7 C) (Oral)   Resp 14   Ht 5\' 9"  (1.753 m)   Wt 82.6 kg (182 lb)   SpO2 100%   BMI 26.88 kg/m   Physical Exam  Constitutional: He appears well-developed and well-nourished.  Non-toxic appearance. No distress.  HENT:  Head: Normocephalic and atraumatic.  Eyes: Conjunctivae are normal. Right eye exhibits no discharge. Left eye exhibits no discharge.  Cardiovascular:  Pulses:      Dorsalis pedis pulses are 2+ on the right side, and 2+ on the left side.  Musculoskeletal:  Right foot is atraumatic in appearance. There is no obvious deformity or appreciable swelling. No erythema or warmth. Full ROM at the ankle, patient is able to move all digits. Patient with tenderness to palpation over the dorsum of the foot.   Neurological: He is alert.  Clear speech. 5/5 strength with plantar/dorsi flexion to bilateral lower extremities. Patient's sensation grossly intact to bilateral lower extremities.   Skin: Capillary refill takes less than 2 seconds.  Psychiatric: He has a normal mood and affect. His behavior is normal. Thought content normal.  Nursing note and vitals reviewed.   ED Treatments / Results  Labs (all labs ordered are listed, but only abnormal results are displayed) Labs Reviewed - No data to display  EKG  EKG Interpretation None  Radiology No results found.  Procedures Procedures (including critical care time)  Medications Ordered in ED Medications - No data to display   Initial Impression / Assessment and Plan / ED Course  I have reviewed the triage vital signs and the nursing notes.  Pertinent labs & imaging results that were available during my care of the patient were reviewed by me and considered in my medical decision making (see chart for details).   Patient presents today with complaints of right foot pain. He has no signs of trauma or infection of the right foot.  He reports this feels similar to episode of gout,  he is requesting naproxen as this is helped in the past. Patient is afebrile and without recent surgical procedure or overlying erythema or warmth therefore doubt septic joint or cellulitis. No hx of injury, nonfocal tenderness, doubt fracture or dislocation. Will treat with naproxen (last creatinine WNL), patient already with PCP follow up appointment scheduled 01/24. Patient's blood pressure elevated in the emergency department today. Patient denies headache, change in vision, numbness, weakness, chest pain, dyspnea, dizziness, or lightheadedness therefore doubt hypertensive emergency. Discussed elevated BP and need for recheck at PCP follow up appointment. I discussed treatment plan, need for PCP follow-up, and return precautions with the patient. Provided opportunity for questions, patient confirmed understanding and is in agreement with plan.    Final Clinical Impressions(s) / ED Diagnoses   Final diagnoses:  Acute gout of right foot, unspecified cause    ED Discharge Orders        Ordered    naproxen (NAPROSYN) 500 MG tablet  2 times daily     09/11/17 1458       Marcy Sookdeo, Pleas KochSamantha R, PA-C 09/11/17 1508    Lorre NickAllen, Anthony, MD 09/12/17 [phone]

## 2017-09-11 NOTE — Discharge Instructions (Signed)
You were seen in the emergency department for gout. We have given you a prescription for Naproxen to treat the gout like you have received previously.   Naproxen is a nonsteroidal anti-inflammatory medication that will help with pain and swelling. Be sure to take this medication as prescribed with food, 1 pill every 12 hours,  It should be taken with food, as it can cause stomach upset, and more seriously, stomach bleeding. Do not take other nonsteroidal anti-inflammatory medications with this such as Advil, Motrin, or Aleve.   Follow up with your primary care provider as scheduled 01/24.  Return to the emergency department for any new or worsening symptoms including but not limited to numbness, weakness, worsening of your pain, difficulty walking, or any other concerns.

## 2017-09-18 ENCOUNTER — Encounter (INDEPENDENT_AMBULATORY_CARE_PROVIDER_SITE_OTHER): Payer: Self-pay | Admitting: Physician Assistant

## 2017-09-18 ENCOUNTER — Ambulatory Visit (INDEPENDENT_AMBULATORY_CARE_PROVIDER_SITE_OTHER): Payer: Self-pay | Admitting: Physician Assistant

## 2017-09-18 ENCOUNTER — Other Ambulatory Visit: Payer: Self-pay

## 2017-09-18 VITALS — BP 159/95 | HR 69 | Temp 97.8°F | Ht 69.69 in | Wt 184.6 lb

## 2017-09-18 DIAGNOSIS — M109 Gout, unspecified: Secondary | ICD-10-CM

## 2017-09-18 MED ORDER — ALLOPURINOL 300 MG PO TABS
300.0000 mg | ORAL_TABLET | Freq: Every day | ORAL | 6 refills | Status: DC
Start: 2017-09-18 — End: 2019-04-13

## 2017-09-18 MED ORDER — COLCHICINE 0.6 MG PO TABS: ORAL_TABLET | ORAL | 0 refills | Status: DC

## 2017-09-18 MED FILL — !COLCRYS 0.6 MG TABLET: 0.6 MG | 14 days supply | Qty: 16 | Fill #0

## 2017-09-18 MED FILL — ?ALLOPURINOL 300 MG TABLET: 300 | 30 days supply | Qty: 30 | Fill #0

## 2017-09-18 NOTE — Progress Notes (Signed)
Subjective:  Patient ID: Brian Erickson, male    DOB: 07/31/1966  Age: 52 y.o. MRN: [id number]  CC: gout  HPI Brian Erickson is a 52 y.o. male with a medical history of gout presents for hospital f/u of gout of right foot. ED visit 7 days ago. Diagnosed with gout of right foot and prescribed Naproxen. Patient has not filled naproxen due to lack of funds. Patient continues with severe pain in the talar region of the right foot. Reports exquisite tenderness to palpation. Painful to wear sneaker due to contact with the foot. Does not endorse alcohol consumption but eats fatty foods. BP is noted to be elevated since his gout flares began again one month ago. Does not endorse CP, palpitations, SOB, HA, abdominal pain, other arthralgias, rash, f/c/n/v, or GI/GU sxs.        ROS Review of Systems  Constitutional: Negative for chills, fever and malaise/fatigue.  Eyes: Negative for blurred vision.  Respiratory: Negative for shortness of breath.   Cardiovascular: Negative for chest pain and palpitations.  Gastrointestinal: Negative for abdominal pain and nausea.  Genitourinary: Negative for dysuria and hematuria.  Musculoskeletal: Positive for joint pain. Negative for myalgias.  Skin: Negative for rash.  Neurological: Negative for tingling and headaches.  Psychiatric/Behavioral: Negative for depression. The patient is not nervous/anxious.     Objective:  BP (!) 176/103 (BP Location: Right Arm, Patient Position: Sitting, Cuff Size: Normal)   Pulse 69   Temp 97.8 F (36.6 C) (Oral)   Ht 5' 9.69" (1.77 m)   Wt 184 lb 9.6 oz (83.7 kg)   SpO2 99%   BMI 26.73 kg/m   BP/Weight 09/18/2017 09/11/2017 08/14/2017  Systolic BP 176 168 135  Diastolic BP 103 95 98  Wt. (Lbs) 184.6 182 165  BMI 26.73 26.88 24.37      Physical Exam  Constitutional: He is oriented to person, place, and time.  Well developed, well nourished, NAD, polite  HENT:  Head: Normocephalic and atraumatic.  Eyes: No  scleral icterus.  Cardiovascular: Normal rate, regular rhythm and normal heart sounds.  Pulmonary/Chest: Effort normal and breath sounds normal.  Musculoskeletal: He exhibits no edema.  Exquisite TTP to the talar region of the right foot  Neurological: He is alert and oriented to person, place, and time. No cranial nerve deficit.  Antalgic gait favoring right side  Skin: Skin is warm and dry. No rash noted. No erythema. No pallor.  No erythema, edema, ecchymosis, or increased warmth in the affected region on the right foot.  Psychiatric: He has a normal mood and affect. His behavior is normal. Thought content normal.  Vitals reviewed.    Assessment & Plan:    1. Acute gout of right foot, unspecified cause - Uric Acid - Begin colchicine 0.6 MG tablet; Day One- Take two tablets at once, wait one hour, then take one more tablet. Day 2 and thereafter - Take one tablet daily.  Dispense: 16 tablet; Refill: 0 - Begin allopurinol (ZYLOPRIM) 300 MG tablet; Take 1 tablet (300 mg total) by mouth daily.  Dispense: 30 tablet; Refill: 6. Patient instructed to begin Allopurinol after resolution of pain with colchicine. Pt understood and repeated back instruction.  - Comprehensive metabolic panel   Meds ordered this encounter  Medications  . colchicine 0.6 MG tablet    Sig: Day One- Take two tablets at once, wait one hour, then take one more tablet. Day 2 and thereafter - Take one tablet daily.  Dispense:  16 tablet    Refill:  0    Order Specific Question:   Supervising Provider    Answer:   Quentin Angst [id number]  . allopurinol (ZYLOPRIM) 300 MG tablet    Sig: Take 1 tablet (300 mg total) by mouth daily.    Dispense:  30 tablet    Refill:  6    Order Specific Question:   Supervising Provider    Answer:   Quentin Angst [[id number]]    Follow-up: Return in about 3 weeks (around 10/09/2017) for Physical exam.   Loletta Specter PA

## 2017-09-18 NOTE — Patient Instructions (Signed)

## 2017-09-19 ENCOUNTER — Telehealth (INDEPENDENT_AMBULATORY_CARE_PROVIDER_SITE_OTHER): Payer: Self-pay

## 2017-09-19 LAB — COMPREHENSIVE METABOLIC PANEL
A/G RATIO: 1.8 (ref 1.2–2.2)
ALT: 15 IU/L (ref 0–44)
AST: 18 IU/L (ref 0–40)
Albumin: 4.8 g/dL (ref 3.5–5.5)
Alkaline Phosphatase: 110 IU/L (ref 39–117)
BILIRUBIN TOTAL: 0.4 mg/dL (ref 0.0–1.2)
BUN/Creatinine Ratio: 9 (ref 9–20)
BUN: 10 mg/dL (ref 6–24)
CALCIUM: 9.6 mg/dL (ref 8.7–10.2)
CHLORIDE: 106 mmol/L (ref 96–106)
CO2: 21 mmol/L (ref 20–29)
Creatinine, Ser: 1.15 mg/dL (ref 0.76–1.27)
GFR, EST AFRICAN AMERICAN: 83 mL/min/{1.73_m2} (ref >59)
GFR, EST NON AFRICAN AMERICAN: 72 mL/min/{1.73_m2} (ref >59)
GLOBULIN, TOTAL: 2.7 g/dL (ref 1.5–4.5)
Glucose: 109 mg/dL — ABNORMAL HIGH (ref 65–99)
POTASSIUM: 4.3 mmol/L (ref 3.5–5.2)
SODIUM: 143 mmol/L (ref 134–144)
Total Protein: 7.5 g/dL (ref 6.0–8.5)

## 2017-09-19 LAB — URIC ACID: Uric Acid: 7.4 mg/dL (ref 3.7–8.6)

## 2017-09-19 NOTE — Telephone Encounter (Signed)
-----   Message from Loletta Specteroger David Gomez, PA-C sent at 09/19/2017  1:42 PM EST ----- Need to lower uric acid in blood. Take medications as directed. Liver and kidney results normal.

## 2017-09-19 NOTE — Telephone Encounter (Signed)
Patient aware of uric acid levels needing to be lowered, he picked up medication today and began taking as directed. Aware that liver and kidney results are normal. Brian Erickson, CMA

## 2017-11-16 ENCOUNTER — Emergency Department (HOSPITAL_COMMUNITY)
Admission: EM | Admit: 2017-11-16 | Discharge: 2017-11-16 | Disposition: A | Discharge status: Home / Self Care | Payer: Medicaid Other | Attending: Emergency Medicine | Admitting: Emergency Medicine

## 2017-11-16 DIAGNOSIS — R1010 Upper abdominal pain, unspecified: Secondary | ICD-10-CM

## 2017-11-16 DIAGNOSIS — A084 Viral intestinal infection, unspecified: Secondary | ICD-10-CM | POA: Insufficient documentation

## 2017-11-16 DIAGNOSIS — R112 Nausea with vomiting, unspecified: Secondary | ICD-10-CM

## 2017-11-16 DIAGNOSIS — R197 Diarrhea, unspecified: Secondary | ICD-10-CM

## 2017-11-16 DIAGNOSIS — Z79899 Other long term (current) drug therapy: Secondary | ICD-10-CM | POA: Insufficient documentation

## 2017-11-16 LAB — LIPASE, BLOOD: LIPASE: 39 U/L (ref 11–51)

## 2017-11-16 LAB — URINALYSIS, ROUTINE W REFLEX MICROSCOPIC
Bilirubin Urine: NEGATIVE
GLUCOSE, UA: NEGATIVE mg/dL
HGB URINE DIPSTICK: NEGATIVE
Ketones, ur: NEGATIVE mg/dL
Leukocytes, UA: NEGATIVE
Nitrite: NEGATIVE
Protein, ur: NEGATIVE mg/dL
Specific Gravity, Urine: 1.014 (ref 1.005–1.030)
pH: 5 (ref 5.0–8.0)

## 2017-11-16 LAB — CBC
HCT: 47.6 % (ref 39.0–52.0)
Hemoglobin: 15.7 g/dL (ref 13.0–17.0)
MCH: 29.5 pg (ref 26.0–34.0)
MCHC: 33 g/dL (ref 30.0–36.0)
MCV: 89.3 fL (ref 78.0–100.0)
PLATELETS: 293 10*3/uL (ref 150–400)
RBC: 5.33 MIL/uL (ref 4.22–5.81)
RDW: 13.4 % (ref 11.5–15.5)
WBC: 4.5 10*3/uL (ref 4.0–10.5)

## 2017-11-16 LAB — COMPREHENSIVE METABOLIC PANEL
ALBUMIN: 3.9 g/dL (ref 3.5–5.0)
ALT: 14 U/L — AB (ref 17–63)
AST: 21 U/L (ref 15–41)
Alkaline Phosphatase: 67 U/L (ref 38–126)
Anion gap: 9 (ref 5–15)
BUN: 10 mg/dL (ref 6–20)
CO2: 23 mmol/L (ref 22–32)
CREATININE: 1.16 mg/dL (ref 0.61–1.24)
Calcium: 8.9 mg/dL (ref 8.9–10.3)
Chloride: 108 mmol/L (ref 101–111)
GFR calc Af Amer: >60 mL/min (ref >60)
GFR calc non Af Amer: >60 mL/min (ref >60)
GLUCOSE: 103 mg/dL — AB (ref 65–99)
Potassium: 3.8 mmol/L (ref 3.5–5.1)
SODIUM: 140 mmol/L (ref 135–145)
Total Bilirubin: 0.8 mg/dL (ref 0.3–1.2)
Total Protein: 7 g/dL (ref 6.5–8.1)

## 2017-11-16 MED ORDER — ONDANSETRON 4 MG PO TBDP: 4.0000 mg | ORAL_TABLET | Freq: Three times a day (TID) | ORAL | 0 refills | Status: DC | PRN

## 2017-11-16 MED ORDER — ONDANSETRON 8 MG PO TBDP
8.0000 mg | ORAL_TABLET | Freq: Once | ORAL | Status: AC
  Administered 2017-11-16: 8 mg via ORAL
  Filled 2017-11-16: qty 1

## 2017-11-16 NOTE — ED Notes (Signed)
Pt given a coke per his request

## 2017-11-16 NOTE — ED Triage Notes (Signed)
Pt complains of n/v/d and upper abdominal pain since yesterday.

## 2017-11-16 NOTE — Discharge Instructions (Addendum)
Use zofran as prescribed, as needed for nausea. Alternate between tylenol and motrin as needed for pain. May consider using over the counter tums, maalox, pepto bismol, or other over the counter remedies to help with symptoms. Stay well hydrated with small sips of fluids throughout the day. Follow a BRAT (banana-rice-applesauce-toast) diet as described below for the next 24-48 hours. The 'BRAT' diet is suggested, then progress to diet as tolerated as symptoms abate. Call your regular doctor if bloody stools, persistent diarrhea, vomiting, fever or abdominal pain. Follow up with your regular doctor in 1 week for recheck of symptoms. Return to ER for changing or worsening of symptoms.   °

## 2017-11-16 NOTE — ED Provider Notes (Signed)
[facility]   CSN: [id number] Arrival date & time: 11/16/17  1412     History   Chief Complaint Chief Complaint  Patient presents with  . Abdominal Pain    HPI Brian Erickson is a 52 y.o. male with a PMHx of gout, who presents to the ED with complaints of nausea, vomiting, diarrhea, and right lateral/upper abdominal pain that began yesterday morning and has gradually improved.  Patient states that he woke up with his symptoms, thought that it was likely because of something that he ate the night before although he denies any known suspicious food intake.  Although he was feeling somewhat better today, his job wanted him to get a work note therefore he came in for evaluation and a work note.  He describes his pain as 7/10 intermittent sharp nonradiating right lateral/upper abdominal pain that worsens with movement and was improved after he ate chicken noodle soup and ginger ale.  He reports nausea, 4-5 episodes of nonbloody nonbilious emesis, and 4 episodes of nonbloody diarrhea.  Overall, all of his symptoms are improving.  He denies fevers, chills, CP, SOB, constipation, obstipation, melena, hematochezia, hematemesis, hematuria, dysuria, myalgias, arthralgias, numbness, tingling, focal weakness, or any other complaints at this time. Denies recent travel, sick contacts, suspicious food intake, EtOH use, NSAID use, or prior abd surgeries.   The history is provided by the patient and medical records. No language interpreter was used.  Abdominal Pain   This is a new problem. The current episode started yesterday. The problem occurs rarely. The problem has been rapidly improving. The pain is associated with an unknown factor. The pain is located in the RUQ. The quality of the pain is sharp. The pain is at a severity of 7/10. The pain is moderate. Associated symptoms include diarrhea, nausea and vomiting. Pertinent negatives include fever, flatus,  hematochezia, melena, constipation, dysuria, hematuria, arthralgias and myalgias. The symptoms are aggravated by activity. Relieved by: chicken noodle soup and ginger ale.    Past Medical History:  Diagnosis Date  . Gout     Patient Active Problem List   Diagnosis Date Noted  . Screening examination for STD (sexually transmitted disease) 12/24/2016    No past surgical history on file.      Home Medications    Prior to Admission medications   Medication Sig Start Date End Date Taking? Authorizing Provider  allopurinol (ZYLOPRIM) 300 MG tablet Take 1 tablet (300 mg total) by mouth daily. 09/18/17   Loletta SpecterGomez, Roger David, PA-C  colchicine 0.6 MG tablet Day One- Take two tablets at once, wait one hour, then take one more tablet. Day 2 and thereafter - Take one tablet daily. 09/18/17   Loletta SpecterGomez, Roger David, PA-C  naproxen (NAPROSYN) 500 MG tablet Take 1 tablet (500 mg total) by mouth 2 (two) times daily. 09/11/17   Petrucelli, Pleas KochSamantha R, PA-C    Family History No family history on file.  Social History Social History   Tobacco Use  . Smoking status: Never Smoker  . Smokeless tobacco: Never Used  Substance Use Topics  . Alcohol use: No    Comment: socially   . Drug use: No     Allergies   Patient has no known allergies.   Review of Systems Review of Systems  Constitutional: Negative for chills and fever.  Respiratory: Negative for shortness of breath.   Cardiovascular: Negative for chest pain.  Gastrointestinal: Positive for abdominal pain, diarrhea, nausea and vomiting. Negative  for blood in stool, constipation, flatus, hematochezia and melena.  Genitourinary: Negative for dysuria and hematuria.  Musculoskeletal: Negative for arthralgias and myalgias.  Skin: Negative for color change.  Allergic/Immunologic: Negative for immunocompromised state.  Neurological: Negative for weakness and numbness.  Psychiatric/Behavioral: Negative for confusion.   All other systems  reviewed and are negative for acute change except as noted in the HPI.    Physical Exam Updated Vital Signs BP (!) 146/84 (BP Location: Right Arm)   Pulse 62   Temp 98.1 F (36.7 C) (Oral)   Resp 18   SpO2 97%   Physical Exam  Constitutional: He is oriented to person, place, and time. Vital signs are normal. He appears well-developed and well-nourished.  Non-toxic appearance. No distress.  Afebrile, nontoxic, NAD  HENT:  Head: Normocephalic and atraumatic.  Mouth/Throat: Oropharynx is clear and moist and mucous membranes are normal.  Eyes: Conjunctivae and EOM are normal. Right eye exhibits no discharge. Left eye exhibits no discharge.  Neck: Normal range of motion. Neck supple.  Cardiovascular: Normal rate, regular rhythm, normal heart sounds and intact distal pulses. Exam reveals no gallop and no friction rub.  No murmur heard. Pulmonary/Chest: Effort normal and breath sounds normal. No respiratory distress. He has no decreased breath sounds. He has no wheezes. He has no rhonchi. He has no rales.  Abdominal: Soft. Normal appearance and bowel sounds are normal. He exhibits no distension. There is no tenderness. There is no rigidity, no rebound, no guarding, no CVA tenderness, no tenderness at McBurney's point and negative Murphy's sign.  Soft, NTND, +BS throughout, no r/g/r, neg murphy's, neg mcburney's, no CVA TTP   Musculoskeletal: Normal range of motion.  Neurological: He is alert and oriented to person, place, and time. He has normal strength. No sensory deficit.  Skin: Skin is warm, dry and intact. No rash noted.  Psychiatric: He has a normal mood and affect.  Nursing note and vitals reviewed.    ED Treatments / Results  Labs (all labs ordered are listed, but only abnormal results are displayed) Labs Reviewed  COMPREHENSIVE METABOLIC PANEL - Abnormal; Notable for the following components:      Result Value   Glucose, Bld 103 (*)    ALT 14 (*)    All other components  within normal limits  LIPASE, BLOOD  CBC  URINALYSIS, ROUTINE W REFLEX MICROSCOPIC    EKG None  Radiology No results found.  Procedures Procedures (including critical care time)  Medications Ordered in ED Medications  ondansetron (ZOFRAN-ODT) disintegrating tablet 8 mg (8 mg Oral Given 11/16/17 1801)     Initial Impression / Assessment and Plan / ED Course  I have reviewed the triage vital signs and the nursing notes.  Pertinent labs & imaging results that were available during my care of the patient were reviewed by me and considered in my medical decision making (see chart for details).     52 y.o. male here with n/v/d and upper abd pain that began yesterday, he thinks it was something he ate the night before. Feels somewhat better today but had to get a doctor's note, so he came in for eval. On exam, no abdominal tenderness at all, neg murphy's sign, afebrile and nontoxic. Work up thus far reveals: lipase WNL, CMP WNL, CBC WNL. Awaiting U/A. Likely viral gastroenteritis illness, vs possibly food borne illness, however he is well appearing and has a reassuring work up thus far. Does not appear dehydrated. Will give ODT zofran, PO challenge,  and await U/A. Doubt need for IVFs. Will reassess shortly.   6:55 PM U/A unremarkable. Pt feeling better and tolerating PO well here. Likely viral gastroenteritis vs foodborne illness. Discussed OTC remedies for symptomatic relief, BRAT diet, adequate hydration, and will rx zofran. Advised tylenol/motrin for pain. F/up with PCP in 1wk for recheck of symptoms. I explained the diagnosis and have given explicit precautions to return to the ER including for any other new or worsening symptoms. The patient understands and accepts the medical plan as it's been dictated and I have answered their questions. Discharge instructions concerning home care and prescriptions have been given. The patient is STABLE and is discharged to home in good condition.      Final Clinical Impressions(s) / ED Diagnoses   Final diagnoses:  Nausea vomiting and diarrhea  Upper abdominal pain  Viral gastroenteritis    ED Discharge Orders        Ordered    ondansetron (ZOFRAN ODT) 4 MG disintegrating tablet  Every 8 hours PRN     11/16/17 [address] [employer], PA-C 11/16/17 1855    Melene Plan, DO 11/16/17 2241

## 2017-12-28 ENCOUNTER — Encounter (HOSPITAL_COMMUNITY): Payer: Self-pay | Admitting: Emergency Medicine

## 2017-12-28 ENCOUNTER — Emergency Department (HOSPITAL_COMMUNITY)
Admission: EM | Admit: 2017-12-28 | Discharge: 2017-12-28 | Disposition: A | Discharge status: Home / Self Care | Payer: Medicaid Other | Attending: Emergency Medicine | Admitting: Emergency Medicine

## 2017-12-28 ENCOUNTER — Other Ambulatory Visit: Payer: Self-pay

## 2017-12-28 DIAGNOSIS — M109 Gout, unspecified: Secondary | ICD-10-CM

## 2017-12-28 DIAGNOSIS — M10071 Idiopathic gout, right ankle and foot: Secondary | ICD-10-CM | POA: Insufficient documentation

## 2017-12-28 DIAGNOSIS — Z79899 Other long term (current) drug therapy: Secondary | ICD-10-CM | POA: Insufficient documentation

## 2017-12-28 MED ORDER — NAPROXEN 250 MG PO TABS
500.0000 mg | ORAL_TABLET | Freq: Once | ORAL | Status: AC
  Administered 2017-12-28: 500 mg via ORAL
  Filled 2017-12-28: qty 2

## 2017-12-28 MED ORDER — COLCHICINE 0.6 MG PO TABS: 0.6000 mg | ORAL_TABLET | Freq: Every day | ORAL | 0 refills | Status: DC

## 2017-12-28 MED ORDER — NAPROXEN 500 MG PO TABS: 500.0000 mg | ORAL_TABLET | Freq: Two times a day (BID) | ORAL | 0 refills | Status: DC

## 2017-12-28 MED ORDER — COLCHICINE 0.6 MG PO TABS
1.2000 mg | ORAL_TABLET | Freq: Once | ORAL | Status: AC
  Administered 2017-12-28: 1.2 mg via ORAL
  Filled 2017-12-28: qty 2

## 2017-12-28 NOTE — Discharge Instructions (Signed)
Take colchicine once daily.  Take Naprosyn twice daily for pain.  Do not take ibuprofen, Advil, Aleve, or Motrin.  You may take 500 to 1000 mg of Tylenol every 6 hours as needed for pain.  Apply ice for comfort.  Follow-up with your primary care physician for reevaluation of your symptoms.  Return to the emergency department if any concerning signs or symptoms develop.

## 2017-12-28 NOTE — ED Provider Notes (Signed)
[facility] EMERGENCY DEPARTMENT Provider Note   CSN: [id number] Arrival date & time: 12/28/17  0603     History   Chief Complaint Chief Complaint  Patient presents with  . Foot Pain    HPI Brian Erickson is a 52 y.o. male with history of gout presents today for evaluation of acute onset, constant pain to the dorsum of the right foot.  Pain began at around 3:30 AM this morning and woke him from his sleep.  He states it feels like his usual gout pain.  Pain is sharp, does not radiate.  Worsens with ambulation movement and palpation.  Thinks this may have been precipitated after eating tomatoes.  He states he is out of his colchicine and Naprosyn but has been taking his Allopurinol. Denies nausea, vomiting, fevers, chills, numbness, tingling, or weakness.  He denies any trauma or falls.   He states "I know with this is I am just trying to get ahead of it ".  The history is provided by the patient.    Past Medical History:  Diagnosis Date  . Gout     Patient Active Problem List   Diagnosis Date Noted  . Screening examination for STD (sexually transmitted disease) 12/24/2016    History reviewed. No pertinent surgical history.      Home Medications    Prior to Admission medications   Medication Sig Start Date End Date Taking? Authorizing Provider  allopurinol (ZYLOPRIM) 300 MG tablet Take 1 tablet (300 mg total) by mouth daily. 09/18/17   Loletta Specter, PA-C  colchicine 0.6 MG tablet Take 1 tablet (0.6 mg total) by mouth daily for 5 days. 12/28/17 01/02/18  Michela Pitcher A, PA-C  naproxen (NAPROSYN) 500 MG tablet Take 1 tablet (500 mg total) by mouth 2 (two) times daily. 12/28/17   Luevenia Maxin, Enyla [place] A, PA-C  ondansetron (ZOFRAN ODT) 4 MG disintegrating tablet Take 1 tablet (4 mg total) by mouth every 8 (eight) hours as needed for nausea or vomiting. 11/16/17   Street, [place], PA-C    Family History No family history on file.  Social History Social History    Tobacco Use  . Smoking status: Never Smoker  . Smokeless tobacco: Never Used  Substance Use Topics  . Alcohol use: No    Comment: socially   . Drug use: No     Allergies   Patient has no known allergies.   Review of Systems Review of Systems   Physical Exam Updated Vital Signs BP (!) 158/73 (BP Location: Right Arm)   Pulse 64   Temp 98.2 F (36.8 C) (Oral)   Resp 16   Ht 5' 9.5" (1.765 m)   Wt 83.5 kg (184 lb)   SpO2 99%   BMI 26.78 kg/m   Physical Exam  Constitutional: He appears well-developed and well-nourished. No distress.  HENT:  Head: Normocephalic and atraumatic.  Eyes: Conjunctivae are normal. Right eye exhibits no discharge. Left eye exhibits no discharge.  Neck: No JVD present. No tracheal deviation present.  Cardiovascular: Normal rate and intact distal pulses.  2+ DP/PT pulses bilaterally, no lower extremity edema  Pulmonary/Chest: Effort normal.  Abdominal: He exhibits no distension.  Musculoskeletal: Normal range of motion. He exhibits tenderness. He exhibits no edema.  Focal tenderness to palpation of the dorsum of the right foot overlying the second metatarsal.  No swelling, crepitus, erythema, or warmth noted.  5/5 strength of BLE major muscle groups.  Ambulatory with mildly antalgic gait.  Neurological:  He is alert. No sensory deficit. Coordination normal.  Fluent speech with no evidence of dysarthria or aphasia, no facial droop, sensation intact to soft touch of bilateral lower extremities. Exhibits good gait and balance  Skin: Skin is warm and dry. No erythema.  Psychiatric: He has a normal mood and affect. His behavior is normal.  Nursing note and vitals reviewed.    ED Treatments / Results  Labs (all labs ordered are listed, but only abnormal results are displayed) Labs Reviewed - No data to display  EKG None  Radiology No results found.  Procedures Procedures (including critical care time)  Medications Ordered in  ED Medications  colchicine tablet 1.2 mg (1.2 mg Oral Given 12/28/17 0720)  naproxen (NAPROSYN) tablet 500 mg (500 mg Oral Given 12/28/17 0720)     Initial Impression / Assessment and Plan / ED Course  I have reviewed the triage vital signs and the nursing notes.  Pertinent labs & imaging results that were available during my care of the patient were reviewed by me and considered in my medical decision making (see chart for details).     Pt presents with monoarticular pain to the right foot since this morning.  Patient is afebrile, vital signs are stable, and he is nontoxic in appearance.  No history of trauma and I doubt occult fracture.  Lab work recently drawn shows normal kidney function.  Doubt septic joint and patient states pain is consistent with usual gout flares in quality, severity, and location. Pt without known peptic ulcer disease and not receiving concurrent treatment on warfarin.  We will refill colchicine and Naprosyn.  Discussed that pt should respond to treatment with in 24 hour of begining treatment & likely resolve in 2-3 days.  Recommend follow-up with primary care physician in the next 2 to 3 days.  Discussed strict ED return precautions. Pt verbalized understanding of and agreement with plan and is safe for discharge home at this time.    Final Clinical Impressions(s) / ED Diagnoses   Final diagnoses:  Acute gout of right foot, unspecified cause    ED Discharge Orders        Ordered    colchicine 0.6 MG tablet  Daily     12/28/17 0705    naproxen (NAPROSYN) 500 MG tablet  2 times daily     12/28/17 0705       Jeanie Sewer, PA-C 12/28/17 0809    Ward, Layla Maw, DO 12/28/17 [phone]

## 2017-12-28 NOTE — ED Triage Notes (Signed)
Pt given a Happy meal  Before meds.

## 2017-12-28 NOTE — ED Notes (Signed)
Declined W/C at D/C and was escorted to lobby by RN. 

## 2017-12-28 NOTE — ED Triage Notes (Signed)
Patient reports pain in right foot.  Mainly on top since yesterday.  Hx of gout.  Hasn't had to take any meds for a while.

## 2018-01-19 ENCOUNTER — Emergency Department (HOSPITAL_COMMUNITY): Payer: Medicaid Other

## 2018-01-19 ENCOUNTER — Emergency Department (HOSPITAL_COMMUNITY)
Admission: EM | Admit: 2018-01-19 | Discharge: 2018-01-19 | Disposition: A | Discharge status: Home / Self Care | Payer: Medicaid Other | Attending: Emergency Medicine | Admitting: Emergency Medicine

## 2018-01-19 ENCOUNTER — Encounter (HOSPITAL_COMMUNITY): Payer: Self-pay | Admitting: *Deleted

## 2018-01-19 ENCOUNTER — Other Ambulatory Visit: Payer: Self-pay

## 2018-01-19 DIAGNOSIS — Z79899 Other long term (current) drug therapy: Secondary | ICD-10-CM | POA: Insufficient documentation

## 2018-01-19 DIAGNOSIS — M7989 Other specified soft tissue disorders: Secondary | ICD-10-CM | POA: Insufficient documentation

## 2018-01-19 DIAGNOSIS — Y939 Activity, unspecified: Secondary | ICD-10-CM | POA: Insufficient documentation

## 2018-01-19 DIAGNOSIS — X58XXXA Exposure to other specified factors, initial encounter: Secondary | ICD-10-CM | POA: Insufficient documentation

## 2018-01-19 DIAGNOSIS — Z23 Encounter for immunization: Secondary | ICD-10-CM | POA: Insufficient documentation

## 2018-01-19 DIAGNOSIS — S6000XA Contusion of unspecified finger without damage to nail, initial encounter: Secondary | ICD-10-CM | POA: Insufficient documentation

## 2018-01-19 DIAGNOSIS — Y929 Unspecified place or not applicable: Secondary | ICD-10-CM | POA: Insufficient documentation

## 2018-01-19 DIAGNOSIS — T1490XA Injury, unspecified, initial encounter: Secondary | ICD-10-CM

## 2018-01-19 DIAGNOSIS — Y999 Unspecified external cause status: Secondary | ICD-10-CM | POA: Insufficient documentation

## 2018-01-19 MED ORDER — LIDOCAINE HCL 2 % IJ SOLN
20.0000 mL | Freq: Once | INTRAMUSCULAR | Status: AC
  Administered 2018-01-19: 400 mg
  Filled 2018-01-19: qty 20

## 2018-01-19 MED ORDER — TETANUS-DIPHTH-ACELL PERTUSSIS 5-2.5-18.5 LF-MCG/0.5 IM SUSP
0.5000 mL | Freq: Once | INTRAMUSCULAR | Status: AC
  Administered 2018-01-19: 0.5 mL via INTRAMUSCULAR
  Filled 2018-01-19: qty 0.5

## 2018-01-19 NOTE — Discharge Instructions (Addendum)
Thank you for allowing me to provide your care today in the emergency department.  Your x-ray was negative for any broken bones or fractures.  You were treated for a hematoma of the left thumb.  You had an incision and drainage performed in the emergency department.  Wound care at home is important to avoid infection.  Wash the incision site of the left thumb at least once daily with warm soap and water.  Pat the area dry, then apply a small amount of bacitracin to the pad of the left thumb.  You can then apply a gauze dressing and loosely placed brown Coban or apply a bandaid.  The incision site may continue to ooze for the next 1 to 2 days.  This is normal.  Your tetanus immunization was updated today.  Take 600 mg of ibuprofen with food or 650 mg of Tylenol once every 6 hours for pain control.  You can apply ice to the thumb to help with pain and swelling.  This can be applied for 15 to 20 minutes up to 3-4 times per day.  If you develop new or worsening symptoms including fever, chills, or if the left thumb becomes red, hot to the touch, more swollen, or if you develop red streaking down the hand or arm, return to the emergency department for reevaluation.

## 2018-01-19 NOTE — ED Provider Notes (Signed)
[facility] EMERGENCY DEPARTMENT Provider Note   CSN: [id number] Arrival date & time: 01/19/18  1059     History   Chief Complaint Chief Complaint  Patient presents with  . Hand Pain    HPI JAYCEON TROY is a 52 y.o. male with a history of gout who presents to the emergency department with a chief complaint of left thumb pain and swelling.  The patient reports constant, worsening pain and swelling to the left thumb over the last 3 to 4 days.  He characterizes the pain as sharp.  He works in [employer], but has no known trauma or injury.  He states that the pain is gotten so severe that he can barely touch the pad of his thumb without jumping.  Pain is improved without palpation or movement.  Worse with pressure or movement.  He denies symptoms to digits 2 through 5 of the left hand, left wrist, elbow, or shoulder pain, numbness, weakness, fever, chills, or redness or warmth to the left thumb.   He has a history of gout, but is only had gout flares previously in the feet.  He states that the current pain and symptoms do not feel like his typical gout flares.  He has been treating his symptoms at home with Tylenol and Motrin with no improvement.  The history is provided by the patient. No language interpreter was used.    Past Medical History:  Diagnosis Date  . Gout     Patient Active Problem List   Diagnosis Date Noted  . Screening examination for STD (sexually transmitted disease) 12/24/2016    History reviewed. No pertinent surgical history.      Home Medications    Prior to Admission medications   Medication Sig Start Date End Date Taking? Authorizing Provider  allopurinol (ZYLOPRIM) 300 MG tablet Take 1 tablet (300 mg total) by mouth daily. 09/18/17   Loletta Specter, PA-C  colchicine 0.6 MG tablet Take 1 tablet (0.6 mg total) by mouth daily for 5 days. 12/28/17 01/02/18  Michela Pitcher A, PA-C  naproxen (NAPROSYN) 500 MG tablet Take 1 tablet  (500 mg total) by mouth 2 (two) times daily. 12/28/17   Luevenia Maxin, Mina A, PA-C  ondansetron (ZOFRAN ODT) 4 MG disintegrating tablet Take 1 tablet (4 mg total) by mouth every 8 (eight) hours as needed for nausea or vomiting. 11/16/17   Street, [place], PA-C    Family History History reviewed. No pertinent family history.  Social History Social History   Tobacco Use  . Smoking status: Never Smoker  . Smokeless tobacco: Never Used  Substance Use Topics  . Alcohol use: No    Comment: socially   . Drug use: No     Allergies   Patient has no known allergies.   Review of Systems Review of Systems  Constitutional: Negative for activity change, appetite change, chills and fever.  Respiratory: Negative for shortness of breath.   Cardiovascular: Negative for chest pain.  Gastrointestinal: Negative for abdominal pain.  Genitourinary: Negative for dysuria.  Musculoskeletal: Positive for arthralgias and myalgias. Negative for back pain and joint swelling.  Skin: Positive for wound. Negative for rash.  Allergic/Immunologic: Negative for immunocompromised state.  Neurological: Negative for dizziness, weakness, light-headedness, numbness and headaches.  Psychiatric/Behavioral: Negative for confusion.   Physical Exam Updated Vital Signs BP (!) 189/103 (BP Location: Right Arm)   Pulse 65   Temp 97.8 F (36.6 C) (Oral)   Resp 18   SpO2 98%  Physical Exam  Constitutional: He appears well-developed.  HENT:  Head: Normocephalic.  Eyes: Conjunctivae are normal.  Neck: Neck supple.  Cardiovascular: Normal rate and regular rhythm.  No murmur heard. Pulmonary/Chest: Effort normal.  Abdominal: Soft. He exhibits no distension.  Musculoskeletal: He exhibits edema and tenderness. He exhibits no deformity.  Swelling noted to the distal aspect of the left thumb.  Decreased capillary refill to the left thumb.  The palmar surface of the distal aspect of the thumb is firm when compared to the right  thumb.  Swelling is also noted to the proximal portion of the left thumb when compared to left, but is nontender.  The left nailbed is atraumatic and intact.  No purulence surrounding the nailbed.  No puncture wounds, erythema, ecchymosis, or warmth to the left thumb.  Full active and passive range of motion of the MCP and IP joint of the left thumb.  Normal exam of digits 2 through 5 end of the left wrist.  No tenderness to the IP or MCP of the left thumb.  Radial pulses are 2+ and symmetric.  Sensation is intact throughout.  Neurological: He is alert.  Skin: Skin is warm and dry.  Psychiatric: His behavior is normal.  Nursing note and vitals reviewed.          ED Treatments / Results  Labs (all labs ordered are listed, but only abnormal results are displayed) Labs Reviewed - No data to display  EKG None  Radiology Dg Hand Complete Left  Result Date: 01/19/2018 CLINICAL DATA:  Left thumb pain and swelling without known injury. EXAM: LEFT HAND - COMPLETE 3+ VIEW COMPARISON:  None. FINDINGS: There is no evidence of fracture or dislocation. There is no evidence of arthropathy or other focal bone abnormality. Soft tissues are unremarkable. IMPRESSION: Normal left hand. Electronically Signed   By: Lupita Raider, M.D.   On: 01/19/2018 13:16    Procedures .Marland KitchenIncision and Drainage Date/Time: 01/19/2018 4:27 PM Performed by: Barkley Boards, PA-C Authorized by: Barkley Boards, PA-C   Consent:    Consent obtained:  Verbal   Consent given by:  Patient   Risks discussed:  Bleeding, incomplete drainage, pain and infection   Alternatives discussed:  No treatment Location:    Type:  Hematoma   Location:  Upper extremity   Upper extremity location:  Finger   Finger location:  L thumb Pre-procedure details:    Skin preparation:  Betadine Anesthesia (see MAR for exact dosages):    Anesthesia method:  Nerve block   Block location:  Left thumb    Block anesthetic:  Lidocaine 2%  w/o epi   Block technique:  Digital Block    Block injection procedure:  Anatomic landmarks identified, introduced needle, incremental injection, negative aspiration for blood and anatomic landmarks palpated   Block outcome:  Anesthesia achieved Procedure type:    Complexity:  Simple Procedure details:    Incision types:  Single straight   Incision depth:  Dermal   Scalpel blade:  11   Wound management:  Probed and deloculated and irrigated with saline   Drainage:  Bloody   Drainage amount:  Copious   Wound treatment:  Wound left open   Packing materials:  None Post-procedure details:    Patient tolerance of procedure:  Tolerated well, no immediate complications   (including critical care time)  EMERGENCY DEPARTMENT US SOFT TISSUE INTERPRETATION "Study: Limited Soft Tissue Ultrasound"  INDICATIONS: Pain Multiple views of the body part were obtained  in real-time with a multi-frequency linear probe  PERFORMED BY: Myself IMAGES ARCHIVED?: Yes SIDE:Left BODY PART:thumb INTERPRETATION:  Fluid collect present on the palmar surface of the distal thumb    Medications Ordered in ED Medications  lidocaine (XYLOCAINE) 2 % (with pres) injection 400 mg (400 mg Infiltration Given 01/19/18 1535)  Tdap (BOOSTRIX) injection 0.5 mL (0.5 mLs Intramuscular Given 01/19/18 1535)     Initial Impression / Assessment and Plan / ED Course  I have reviewed the triage vital signs and the nursing notes.  Pertinent labs & imaging results that were available during my care of the patient were reviewed by me and considered in my medical decision making (see chart for details).     52 year old male with a history of gout presenting with pain and swelling to the left thumb over the last few days.  No known trauma or injury.  On exam there is no warmth or erythema, but the digit is edematous.  No signs of subungual hematoma, felon, or pyogenic tenosynovitis.  Bedside ultrasound demonstrating a fluid  collection on the palmar surface of the left thumb.  The patient was discussed with Dr. Eudelia Bunch, attending physician.  Digital block was performed followed by incision and drainage.  A copious amount of bloody discharge, suggestive of a hematoma, was drained.  The patient tolerated the procedure well.  There was no purulent discharge.  Wound care provided in the ED.  Prior to discharge, the patient's blood pressure was significantly elevated.  On reevaluation, he had no signs or symptoms of endorgan damage.  He has good follow-up with his primary care provider and will call when they reopen to schedule a follow-up appointment to have his blood pressure rechecked and the wound on his left thumb rechecked.  He was also given return precautions to the ED verbally that included signs and symptoms of hypertensive urgency and emergency.  He is in no acute distress.  He has safe for discharge to home with outpatient follow-up at this time.   Final Clinical Impressions(s) / ED Diagnoses   Final diagnoses:  Traumatic hematoma of finger, initial encounter  Swelling of left thumb    ED Discharge Orders    None       Barkley Boards, PA-C 01/19/18 1640    Cardama, Amadeo Garnet, MD 01/19/18 1649

## 2018-01-19 NOTE — ED Triage Notes (Signed)
Pt reports hx of injury to left hand in past but still has pain to left thumb and index finger with swelling. Hx of gout.

## 2018-01-23 ENCOUNTER — Emergency Department (HOSPITAL_COMMUNITY)
Admission: EM | Admit: 2018-01-23 | Discharge: 2018-01-23 | Disposition: A | Discharge status: Home / Self Care | Payer: Medicaid Other | Attending: Emergency Medicine | Admitting: Emergency Medicine

## 2018-01-23 ENCOUNTER — Other Ambulatory Visit: Payer: Self-pay

## 2018-01-23 DIAGNOSIS — L02512 Cutaneous abscess of left hand: Secondary | ICD-10-CM

## 2018-01-23 MED ORDER — IBUPROFEN 600 MG PO TABS: 600.0000 mg | ORAL_TABLET | Freq: Four times a day (QID) | ORAL | 0 refills | Status: DC | PRN

## 2018-01-23 MED ORDER — CEPHALEXIN 500 MG PO CAPS: 500.0000 mg | ORAL_CAPSULE | Freq: Three times a day (TID) | ORAL | 0 refills | Status: DC

## 2018-01-23 MED ORDER — IBUPROFEN 800 MG PO TABS
800.0000 mg | ORAL_TABLET | Freq: Once | ORAL | Status: AC
  Administered 2018-01-23: 800 mg via ORAL
  Filled 2018-01-23: qty 1

## 2018-01-23 MED ORDER — LIDOCAINE HCL 2 % IJ SOLN
10.0000 mL | Freq: Once | INTRAMUSCULAR | Status: AC
  Administered 2018-01-23: 200 mg via INTRADERMAL
  Filled 2018-01-23: qty 20

## 2018-01-23 MED ORDER — SULFAMETHOXAZOLE-TRIMETHOPRIM 800-160 MG PO TABS: 1.0000 | ORAL_TABLET | Freq: Two times a day (BID) | ORAL | 0 refills | Status: DC

## 2018-01-23 NOTE — ED Provider Notes (Signed)
[facility] EMERGENCY DEPARTMENT Provider Note   CSN: [id number] Arrival date & time: 01/23/18  0423     History   Chief Complaint Chief Complaint  Patient presents with  . Finger Injury    left thumb    HPI Brian Erickson is a 52 y.o. male.  HPI   52 year old male with hx of gout presenting c/o thumb pain.  Patient report atraumatic left thumb pain for the past week.  He was initially seen in the ED on 5/26.  An incision and drainage procedure was performed with copious amounts of blood suspect of hematoma causing his swelling.  Patient subsequently discharged home with Tylenol 3.  Report and x-ray was performed to his previous visit without any acute changes.  Since the procedure, he endorsed increasing pain and swelling to the thumb.  Pain is now 10 out of 10, throbbing and stinging with decreased sensation at the tip of his finger.  He is unable to bend his thumb secondary to the pain and swelling.  Pain is now radiates towards his index finger.  Pain medication has not helped.  He is right-hand dominant.   Past Medical History:  Diagnosis Date  . Gout     Patient Active Problem List   Diagnosis Date Noted  . Screening examination for STD (sexually transmitted disease) 12/24/2016    No past surgical history on file.      Home Medications    Prior to Admission medications   Medication Sig Start Date End Date Taking? Authorizing Provider  allopurinol (ZYLOPRIM) 300 MG tablet Take 1 tablet (300 mg total) by mouth daily. 09/18/17   Loletta Specter, PA-C  colchicine 0.6 MG tablet Take 1 tablet (0.6 mg total) by mouth daily for 5 days. 12/28/17 01/02/18  Michela Pitcher A, PA-C  naproxen (NAPROSYN) 500 MG tablet Take 1 tablet (500 mg total) by mouth 2 (two) times daily. 12/28/17   Luevenia Maxin, Mina A, PA-C  ondansetron (ZOFRAN ODT) 4 MG disintegrating tablet Take 1 tablet (4 mg total) by mouth every 8 (eight) hours as needed for nausea or vomiting. 11/16/17   Street,  [place], PA-C    Family History No family history on file.  Social History Social History   Tobacco Use  . Smoking status: Never Smoker  . Smokeless tobacco: Never Used  Substance Use Topics  . Alcohol use: No    Comment: socially   . Drug use: No     Allergies   Patient has no known allergies.   Review of Systems Review of Systems  Constitutional: Negative for fever.  Skin: Positive for wound.  Neurological: Positive for numbness.     Physical Exam Updated Vital Signs BP (!) 152/89   Pulse 91   Temp 98.1 F (36.7 C) (Oral)   Resp 16   Ht 5' 9.5" (1.765 m)   Wt 82.6 kg (182 lb)   SpO2 98%   BMI 26.49 kg/m   Physical Exam  Constitutional: He appears well-developed and well-nourished. No distress.  HENT:  Head: Atraumatic.  Eyes: Conjunctivae are normal.  Neck: Neck supple.  Musculoskeletal: He exhibits tenderness (Left thumb: Thumb is moderately edematous most significant at the tip of finger.  Thumb pad with evidence of previous trauma.  It is exquisitely tender, indurated, with subcutaneous hematoma and a small 3 media incision to the medial aspect. ).  Radial pulse 2+ on the left wrist.  Hand compartment is soft.  No tenderness to first  MCP.  Left index finger nontender to palpation.  Normal nailbed in the left thumb.  No evidence of subungual hematoma.  No signs of paronychia.  Left first IP joint is larger than right first IP joint however it is nontender.  Neurological: He is alert.  Skin: No rash noted.  Psychiatric: He has a normal mood and affect.  Nursing note and vitals reviewed.          ED Treatments / Results  Labs (all labs ordered are listed, but only abnormal results are displayed) Labs Reviewed - No data to display  EKG None  Radiology No results found.  Procedures .Marland KitchenIncision and Drainage Date/Time: 01/23/2018 8:41 AM Performed by: Fayrene Helper, PA-C Authorized by: Fayrene Helper, PA-C   Consent:    Consent obtained:   Verbal   Consent given by:  Patient   Risks discussed:  Incomplete drainage and pain   Alternatives discussed:  No treatment Location:    Type:  Abscess   Size:  1cm   Location:  Upper extremity   Upper extremity location:  Finger   Finger location:  L thumb Pre-procedure details:    Skin preparation:  Betadine Anesthesia (see MAR for exact dosages):    Anesthesia method:  Nerve block   Block needle gauge:  25 G   Block anesthetic:  Lidocaine 2% w/o epi   Block technique:  Digital nerve block   Block injection procedure:  Anatomic landmarks identified and incremental injection   Block outcome:  Anesthesia achieved Procedure type:    Complexity:  Simple Procedure details:    Needle aspiration: no     Incision types:  Stab incision   Incision depth:  Dermal   Scalpel blade:  11   Wound management:  Probed and deloculated   Drainage:  Purulent   Drainage amount:  Scant   Wound treatment:  Wound left open   Packing materials:  None Post-procedure details:    Patient tolerance of procedure:  Tolerated well, no immediate complications   (including critical care time)    Medications Ordered in ED Medications - No data to display   Initial Impression / Assessment and Plan / ED Course  I have reviewed the triage vital signs and the nursing notes.  Pertinent labs & imaging results that were available during my care of the patient were reviewed by me and considered in my medical decision making (see chart for details).     BP (!) 152/89   Pulse 91   Temp 98.1 F (36.7 C) (Oral)   Resp 16   Ht 5' 9.5" (1.765 m)   Wt 82.6 kg (182 lb)   SpO2 98%   BMI 26.49 kg/m    Final Clinical Impressions(s) / ED Diagnoses   Final diagnoses:  Abscess of left thumb    ED Discharge Orders        Ordered    cephALEXin (KEFLEX) 500 MG capsule  3 times daily     01/23/18 0846    sulfamethoxazole-trimethoprim (BACTRIM DS,SEPTRA DS) 800-160 MG tablet  2 times daily     01/23/18  0846    ibuprofen (ADVIL,MOTRIN) 600 MG tablet  Every 6 hours PRN     01/23/18 0846     6:35 AM Patient with atraumatic left thumb pain at the pad of the finger who was seen 3 days ago and had an I&D procedure with findings suggestive of a hematoma.  He is here with worsening pain.  On exam, there is  bruising at the tip of finger with increased induration and tenderness.  No nail involvement, he is neurovascular intact and no evidence of joint involvement.  Given the increasing pain, I question felon.  8:13 AM Appreciate consultation from hand specialist, Dr. Izora Ribas who was able to review images uploaded in epic.  He recommend incision and drainage which can be done in the ED or at his office.  I discussed this with patient and patient prefers for me to incise and drain in the ED.  Will perform I&D procedure.  8:44 AM Successful incision and drainage with purulent discharge noted.  Finger was wrapped by me.  Wound care management provided.  Antibiotic and pain medication prescribed.  Return precautions discussed.    Fayrene Helper, PA-C 01/23/18 9604    Glynn Octave, MD 01/23/18 [phone]

## 2018-01-23 NOTE — Discharge Instructions (Signed)
Please change dressing and wash wound daily.  Take antibiotics as prescribed for the full duration. Return if you have any concerns.

## 2018-01-23 NOTE — ED Triage Notes (Signed)
Patient was seen here on Sunday and was dx with a "blood thing" but states that his finger is worse today than it was when he came in on Sunday.

## 2018-01-23 NOTE — ED Notes (Signed)
ED Provider at bedside. 

## 2018-01-26 ENCOUNTER — Emergency Department (HOSPITAL_COMMUNITY): Payer: Self-pay

## 2018-01-26 ENCOUNTER — Emergency Department (HOSPITAL_COMMUNITY): Payer: Self-pay | Admitting: Certified Registered"

## 2018-01-26 ENCOUNTER — Encounter (HOSPITAL_COMMUNITY): Payer: Self-pay | Admitting: Emergency Medicine

## 2018-01-26 ENCOUNTER — Encounter (HOSPITAL_COMMUNITY)
Admission: EM | Disposition: A | Discharge status: Home / Self Care | Payer: Self-pay | Source: Home / Self Care | Attending: Emergency Medicine

## 2018-01-26 ENCOUNTER — Other Ambulatory Visit: Payer: Self-pay

## 2018-01-26 ENCOUNTER — Emergency Department (HOSPITAL_COMMUNITY)
Admission: EM | Admit: 2018-01-26 | Discharge: 2018-01-26 | Disposition: A | Discharge status: Home / Self Care | Payer: Self-pay | Attending: Emergency Medicine | Admitting: Emergency Medicine

## 2018-01-26 DIAGNOSIS — M109 Gout, unspecified: Secondary | ICD-10-CM | POA: Insufficient documentation

## 2018-01-26 DIAGNOSIS — L03012 Cellulitis of left finger: Secondary | ICD-10-CM

## 2018-01-26 DIAGNOSIS — Z79899 Other long term (current) drug therapy: Secondary | ICD-10-CM | POA: Insufficient documentation

## 2018-01-26 DIAGNOSIS — M659 Synovitis and tenosynovitis, unspecified: Secondary | ICD-10-CM

## 2018-01-26 DIAGNOSIS — M65842 Other synovitis and tenosynovitis, left hand: Secondary | ICD-10-CM | POA: Insufficient documentation

## 2018-01-26 HISTORY — DX: Cellulitis of left finger: L03.012

## 2018-01-26 HISTORY — PX: I&D EXTREMITY: SHX5045

## 2018-01-26 LAB — BASIC METABOLIC PANEL
Anion gap: 9 (ref 5–15)
BUN: 11 mg/dL (ref 6–20)
CHLORIDE: 107 mmol/L (ref 101–111)
CO2: 23 mmol/L (ref 22–32)
Calcium: 9.2 mg/dL (ref 8.9–10.3)
Creatinine, Ser: 1.07 mg/dL (ref 0.61–1.24)
GFR calc Af Amer: >60 mL/min (ref >60)
GLUCOSE: 107 mg/dL — AB (ref 65–99)
POTASSIUM: 4.2 mmol/L (ref 3.5–5.1)
Sodium: 139 mmol/L (ref 135–145)

## 2018-01-26 LAB — CBC WITH DIFFERENTIAL/PLATELET
Abs Immature Granulocytes: 0.1 10*3/uL (ref 0.0–0.1)
Basophils Absolute: 0.1 10*3/uL (ref 0.0–0.1)
Basophils Relative: 1 %
Eosinophils Absolute: 0.1 10*3/uL (ref 0.0–0.7)
Eosinophils Relative: 1 %
HCT: 47.3 % (ref 39.0–52.0)
Hemoglobin: 15.3 g/dL (ref 13.0–17.0)
Immature Granulocytes: 1 %
Lymphocytes Relative: 18 %
Lymphs Abs: 1.9 10*3/uL (ref 0.7–4.0)
MCH: 28.8 pg (ref 26.0–34.0)
MCHC: 32.3 g/dL (ref 30.0–36.0)
MCV: 88.9 fL (ref 78.0–100.0)
MONO ABS: 0.6 10*3/uL (ref 0.1–1.0)
MONOS PCT: 6 %
NEUTROS ABS: 7.3 10*3/uL (ref 1.7–7.7)
Neutrophils Relative %: 73 %
PLATELETS: 358 10*3/uL (ref 150–400)
RBC: 5.32 MIL/uL (ref 4.22–5.81)
RDW: 12.5 % (ref 11.5–15.5)
WBC: 10 10*3/uL (ref 4.0–10.5)

## 2018-01-26 LAB — I-STAT CG4 LACTIC ACID, ED: Lactic Acid, Venous: 0.92 mmol/L (ref 0.5–1.9)

## 2018-01-26 SURGERY — IRRIGATION AND DEBRIDEMENT EXTREMITY
Anesthesia: General | Site: Thumb | Laterality: Left

## 2018-01-26 MED ORDER — MORPHINE SULFATE (PF) 4 MG/ML IV SOLN
4.0000 mg | Freq: Once | INTRAVENOUS | Status: AC
  Administered 2018-01-26: 4 mg via INTRAVENOUS
  Filled 2018-01-26: qty 1

## 2018-01-26 MED ORDER — LIDOCAINE HCL (CARDIAC) PF 100 MG/5ML IV SOSY
PREFILLED_SYRINGE | INTRAVENOUS | Status: DC | PRN
  Administered 2018-01-26: 60 mg via INTRAVENOUS

## 2018-01-26 MED ORDER — HYDROCODONE-ACETAMINOPHEN 5-325 MG PO TABS: 1.0000 | ORAL_TABLET | Freq: Four times a day (QID) | ORAL | 0 refills | Status: DC | PRN

## 2018-01-26 MED ORDER — ONDANSETRON HCL 4 MG/2ML IJ SOLN: 4.0000 mg | Freq: Once | INTRAMUSCULAR | Status: DC | PRN

## 2018-01-26 MED ORDER — ONDANSETRON HCL 4 MG/2ML IJ SOLN
INTRAMUSCULAR | Status: DC | PRN
  Administered 2018-01-26: 4 mg via INTRAVENOUS

## 2018-01-26 MED ORDER — VANCOMYCIN HCL IN DEXTROSE 1-5 GM/200ML-% IV SOLN
INTRAVENOUS | Status: AC
  Filled 2018-01-26: qty 200

## 2018-01-26 MED ORDER — FENTANYL CITRATE (PF) 100 MCG/2ML IJ SOLN
INTRAMUSCULAR | Status: AC
  Administered 2018-01-26: 100 ug via INTRAVENOUS
  Filled 2018-01-26: qty 2

## 2018-01-26 MED ORDER — PROPOFOL 10 MG/ML IV BOLUS
INTRAVENOUS | Status: AC
  Filled 2018-01-26: qty 20

## 2018-01-26 MED ORDER — DEXAMETHASONE SODIUM PHOSPHATE 10 MG/ML IJ SOLN
INTRAMUSCULAR | Status: AC
  Filled 2018-01-26: qty 1

## 2018-01-26 MED ORDER — HYDROMORPHONE HCL 2 MG/ML IJ SOLN
INTRAMUSCULAR | Status: AC
  Filled 2018-01-26: qty 1

## 2018-01-26 MED ORDER — PHENYLEPHRINE HCL 10 MG/ML IJ SOLN
INTRAMUSCULAR | Status: DC | PRN
  Administered 2018-01-26 (×2): 80 ug via INTRAVENOUS

## 2018-01-26 MED ORDER — HYDRALAZINE HCL 20 MG/ML IJ SOLN
INTRAMUSCULAR | Status: AC
  Filled 2018-01-26: qty 1

## 2018-01-26 MED ORDER — SULFAMETHOXAZOLE-TRIMETHOPRIM 800-160 MG PO TABS: 1.0000 | ORAL_TABLET | Freq: Two times a day (BID) | ORAL | 0 refills | Status: DC

## 2018-01-26 MED ORDER — HYDROCODONE-ACETAMINOPHEN 5-325 MG PO TABS
ORAL_TABLET | ORAL | Status: AC
  Filled 2018-01-26: qty 1

## 2018-01-26 MED ORDER — PROPOFOL 10 MG/ML IV BOLUS
INTRAVENOUS | Status: DC | PRN
  Administered 2018-01-26: 150 mg via INTRAVENOUS

## 2018-01-26 MED ORDER — LACTATED RINGERS IV SOLN
INTRAVENOUS | Status: DC | PRN
  Administered 2018-01-26: 10:00:00 via INTRAVENOUS

## 2018-01-26 MED ORDER — HYDROCODONE-ACETAMINOPHEN 5-325 MG PO TABS
1.0000 | ORAL_TABLET | Freq: Four times a day (QID) | ORAL | Status: DC | PRN
Start: 2018-01-26 — End: 2018-01-26
  Administered 2018-01-26: 1 via ORAL

## 2018-01-26 MED ORDER — FENTANYL CITRATE (PF) 100 MCG/2ML IJ SOLN
INTRAMUSCULAR | Status: DC | PRN
  Administered 2018-01-26: 50 ug via INTRAVENOUS

## 2018-01-26 MED ORDER — ONDANSETRON HCL 4 MG/2ML IJ SOLN
INTRAMUSCULAR | Status: AC
  Filled 2018-01-26: qty 2

## 2018-01-26 MED ORDER — HYDROMORPHONE HCL 2 MG/ML IJ SOLN
0.3000 mg | INTRAMUSCULAR | Status: DC | PRN
  Administered 2018-01-26 (×3): 0.5 mg via INTRAVENOUS

## 2018-01-26 MED ORDER — ROCURONIUM BROMIDE 10 MG/ML (PF) SYRINGE
PREFILLED_SYRINGE | INTRAVENOUS | Status: AC
  Filled 2018-01-26: qty 5

## 2018-01-26 MED ORDER — MIDAZOLAM HCL 2 MG/2ML IJ SOLN
INTRAMUSCULAR | Status: AC
  Filled 2018-01-26: qty 2

## 2018-01-26 MED ORDER — SUCCINYLCHOLINE CHLORIDE 200 MG/10ML IV SOSY
PREFILLED_SYRINGE | INTRAVENOUS | Status: AC
  Filled 2018-01-26: qty 10

## 2018-01-26 MED ORDER — HYDRALAZINE HCL 20 MG/ML IJ SOLN
10.0000 mg | INTRAMUSCULAR | Status: AC | PRN
  Administered 2018-01-26 (×2): 10 mg via INTRAVENOUS

## 2018-01-26 MED ORDER — FENTANYL CITRATE (PF) 250 MCG/5ML IJ SOLN
INTRAMUSCULAR | Status: AC
  Filled 2018-01-26: qty 5

## 2018-01-26 MED ORDER — PHENYLEPHRINE 40 MCG/ML (10ML) SYRINGE FOR IV PUSH (FOR BLOOD PRESSURE SUPPORT)
PREFILLED_SYRINGE | INTRAVENOUS | Status: AC
  Filled 2018-01-26: qty 10

## 2018-01-26 MED ORDER — DOXYCYCLINE HYCLATE 50 MG PO CAPS: 50.0000 mg | ORAL_CAPSULE | Freq: Two times a day (BID) | ORAL | 0 refills | Status: DC

## 2018-01-26 MED ORDER — VANCOMYCIN HCL 1000 MG IV SOLR
INTRAVENOUS | Status: DC | PRN
  Administered 2018-01-26: 1000 mg via INTRAVENOUS

## 2018-01-26 MED ORDER — SENNA-DOCUSATE SODIUM 8.6-50 MG PO TABS: 2.0000 | ORAL_TABLET | Freq: Every day | ORAL | 1 refills | Status: DC

## 2018-01-26 MED ORDER — MIDAZOLAM HCL 5 MG/5ML IJ SOLN
INTRAMUSCULAR | Status: DC | PRN
  Administered 2018-01-26: 2 mg via INTRAVENOUS

## 2018-01-26 MED ORDER — 0.9 % SODIUM CHLORIDE (POUR BTL) OPTIME
TOPICAL | Status: DC | PRN
  Administered 2018-01-26: 1000 mL

## 2018-01-26 MED ORDER — DEXAMETHASONE SODIUM PHOSPHATE 10 MG/ML IJ SOLN
INTRAMUSCULAR | Status: DC | PRN
Start: 2018-01-26 — End: 2018-01-26
  Administered 2018-01-26: 10 mg via INTRAVENOUS

## 2018-01-26 MED ORDER — FENTANYL CITRATE (PF) 100 MCG/2ML IJ SOLN
100.0000 ug | Freq: Once | INTRAMUSCULAR | Status: AC
  Administered 2018-01-26: 100 ug via INTRAVENOUS

## 2018-01-26 SURGICAL SUPPLY — 42 items
BANDAGE ACE 4X5 VEL STRL LF (GAUZE/BANDAGES/DRESSINGS) ×2 IMPLANT
BANDAGE ACE 6X5 VEL STRL LF (GAUZE/BANDAGES/DRESSINGS) ×2 IMPLANT
BNDG COHESIVE 4X5 TAN STRL (GAUZE/BANDAGES/DRESSINGS) ×2 IMPLANT
BNDG GAUZE ELAST 4 BULKY (GAUZE/BANDAGES/DRESSINGS) ×2 IMPLANT
BOOTCOVER CLEANROOM LRG (PROTECTIVE WEAR) ×4 IMPLANT
COVER SURGICAL LIGHT HANDLE (MISCELLANEOUS) ×2 IMPLANT
CUFF TOURNIQUET SINGLE 34IN LL (TOURNIQUET CUFF) IMPLANT
DURAPREP 26ML APPLICATOR (WOUND CARE) ×2 IMPLANT
ELECT REM PT RETURN 9FT ADLT (ELECTROSURGICAL)
ELECTRODE REM PT RTRN 9FT ADLT (ELECTROSURGICAL) IMPLANT
EVACUATOR 1/8 PVC DRAIN (DRAIN) IMPLANT
GAUZE SPONGE 4X4 12PLY STRL (GAUZE/BANDAGES/DRESSINGS) ×2 IMPLANT
GAUZE SPONGE 4X4 12PLY STRL LF (GAUZE/BANDAGES/DRESSINGS) ×2 IMPLANT
GAUZE XEROFORM 1X8 LF (GAUZE/BANDAGES/DRESSINGS) ×2 IMPLANT
GLOVE BIOGEL PI ORTHO PRO SZ8 (GLOVE) ×1
GLOVE ORTHO TXT STRL SZ7.5 (GLOVE) ×2 IMPLANT
GLOVE PI ORTHO PRO STRL SZ8 (GLOVE) ×1 IMPLANT
GLOVE SURG ORTHO 8.0 STRL STRW (GLOVE) ×4 IMPLANT
GOWN STRL REUS W/ TWL LRG LVL3 (GOWN DISPOSABLE) IMPLANT
GOWN STRL REUS W/TWL LRG LVL3 (GOWN DISPOSABLE)
HANDPIECE INTERPULSE COAX TIP (DISPOSABLE)
KIT BASIN OR (CUSTOM PROCEDURE TRAY) ×2 IMPLANT
KIT TURNOVER KIT B (KITS) ×2 IMPLANT
MANIFOLD NEPTUNE II (INSTRUMENTS) ×2 IMPLANT
NS IRRIG 1000ML POUR BTL (IV SOLUTION) ×2 IMPLANT
PACK ORTHO EXTREMITY (CUSTOM PROCEDURE TRAY) ×2 IMPLANT
PAD ARMBOARD 7.5X6 YLW CONV (MISCELLANEOUS) ×4 IMPLANT
PAD CAST 4YDX4 CTTN HI CHSV (CAST SUPPLIES) IMPLANT
PADDING CAST COTTON 4X4 STRL (CAST SUPPLIES) ×2
SET HNDPC FAN SPRY TIP SCT (DISPOSABLE) IMPLANT
SPLINT PLASTER EXTRA FAST 3X15 (CAST SUPPLIES) ×1
SPLINT PLASTER GYPS XFAST 3X15 (CAST SUPPLIES) IMPLANT
SPONGE LAP 18X18 X RAY DECT (DISPOSABLE) ×2 IMPLANT
STOCKINETTE IMPERVIOUS 9X36 MD (GAUZE/BANDAGES/DRESSINGS) ×2 IMPLANT
SUT ETHILON 3 0 PS 1 (SUTURE) IMPLANT
SWAB CULTURE ESWAB REG 1ML (MISCELLANEOUS) IMPLANT
TOWEL OR 17X24 6PK STRL BLUE (TOWEL DISPOSABLE) ×2 IMPLANT
TOWEL OR 17X26 10 PK STRL BLUE (TOWEL DISPOSABLE) ×2 IMPLANT
TUBE CONNECTING 12X1/4 (SUCTIONS) ×2 IMPLANT
UNDERPAD 30X30 (UNDERPADS AND DIAPERS) ×2 IMPLANT
WATER STERILE IRR 1000ML POUR (IV SOLUTION) ×2 IMPLANT
YANKAUER SUCT BULB TIP NO VENT (SUCTIONS) ×2 IMPLANT

## 2018-01-26 NOTE — ED Triage Notes (Signed)
Pt states his thumb wound on his left hand has continued to get worse despite treatment. Pt thumb is swollen and draining.

## 2018-01-26 NOTE — Anesthesia Postprocedure Evaluation (Signed)
Anesthesia Post Note  Patient: Brian Erickson  Procedure(s) Performed: IRRIGATION AND DEBRIDEMENT LEFT THUMB (Left Thumb)     Patient location during evaluation: PACU Anesthesia Type: General Level of consciousness: awake and alert Pain management: pain level controlled Vital Signs Assessment: post-procedure vital signs reviewed and stable Respiratory status: spontaneous breathing, nonlabored ventilation, respiratory function stable and patient connected to nasal cannula oxygen Cardiovascular status: blood pressure returned to baseline and stable Postop Assessment: no apparent nausea or vomiting Anesthetic complications: no    Last Vitals:  Vitals:   01/26/18 1143 01/26/18 1145  BP: (!) 170/104 (!) 170/104  Pulse:  81  Resp:  13  Temp:    SpO2:  97%    Last Pain:  Vitals:   01/26/18 1145  TempSrc:   PainSc: 8                  Kennieth RadFitzgerald, Brian Erickson

## 2018-01-26 NOTE — Transfer of Care (Signed)
Immediate Anesthesia Transfer of Care Note  Patient: Brian Erickson  Procedure(s) Performed: IRRIGATION AND DEBRIDEMENT LEFT THUMB (Left Thumb)  Patient Location: PACU  Anesthesia Type:General  Level of Consciousness: awake, alert  and sedated  Airway & Oxygen Therapy: Patient Spontanous Breathing  Post-op Assessment: Post -op Vital signs reviewed and stable  Post vital signs: stable  Last Vitals:  Vitals Value Taken Time  BP 199/122 01/26/2018 11:08 AM  Temp    Pulse 76 01/26/2018 11:09 AM  Resp 14 01/26/2018 11:08 AM  SpO2 100 % 01/26/2018 11:09 AM  Vitals shown include unvalidated device data.  Last Pain:  Vitals:   01/26/18 0903  TempSrc: Oral  PainSc: 10-Worst pain ever         Complications: No apparent anesthesia complications

## 2018-01-26 NOTE — Discharge Instructions (Signed)
Diet: As you were doing prior to hospitalization   Shower:  May shower but keep the wounds dry, use an occlusive plastic wrap, NO SOAKING IN TUB.  If the bandage gets wet, change with a clean dry gauze.  If you have a splint on, leave the splint in place and keep the splint dry with a plastic bag.  Dressing:  Change the dressings daily with dry bandages.   Activity:  Increase activity slowly as tolerated, but follow the weight bearing instructions below.  The rules on driving is that you can not be taking narcotics while you drive, and you must feel in control of the vehicle.    Weight Bearing:   Movement as tolerated.    To prevent constipation: you may use a stool softener such as -  Colace (over the counter) 100 mg by mouth twice a day  Drink plenty of fluids (prune juice may be helpful) and high fiber foods Miralax (over the counter) for constipation as needed.    Itching:  If you experience itching with your medications, try taking only a single pain pill, or even half a pain pill at a time.  You may take up to 10 pain pills per day, and you can also use benadryl over the counter for itching or also to help with sleep.   Precautions:  If you experience chest pain or shortness of breath - call 911 immediately for transfer to the hospital emergency department!!  If you develop a fever greater that 101 F, purulent drainage from wound, increased redness or drainage from wound, or calf pain -- Call the office at [phone]                                                Follow- Up Appointment:  Please call for an appointment to be seen in 1 [place] - [phone]

## 2018-01-26 NOTE — Progress Notes (Signed)
Called from ER regarding persistent left thumb infection.  Reviewed clinical pictures, high suspicion for flexor tendon synovitis.  Plan for surgical irrigation and debridement of the left thumb later today.  Communicated with operating room, as well as ER staff, to communicate with patient, and will consult to follow.  N.p.o., hold antibiotics until surgical cultures taken.  Eulas PostJoshua P Jair Lindblad, MD

## 2018-01-26 NOTE — ED Provider Notes (Signed)
[facility] EM[facility]GENCY [facility] Provider Note   CSN: [id number] Arrival date & time: 01/26/18  [id number]     History   Chief Complaint Chief Complaint  Patient presents with  . Hand Pain    HPI Brian Erickson is a 52 y.o. male with history of gout is here for worsening pain to the left thumb.  Associated symptoms include warmth, swelling, drainage, now having pain radiating up into the wrist, decreased range of motion secondary to pain.  He denies fevers, chills, IV drug use, history of diabetes, recent trauma.  History of thumb fracture 1 year ago.  Never had gout on this joint before.  Was seen in the ER on 5/30, had I&D and discharged with keflex and bactrim. He has been compliant with abx, warm compresses and NSAIDs.   HPI  Past Medical History:  Diagnosis Date  . Gout     Patient Active Problem List   Diagnosis Date Noted  . Screening examination for STD (sexually transmitted disease) 12/24/2016    No past surgical history on file.      Home Medications    Prior to Admission medications   Medication Sig Start Date End Date Taking? Authorizing Provider  allopurinol (ZYLOPRIM) 300 MG tablet Take 1 tablet (300 mg total) by mouth daily. 09/18/17   Loletta SpecterGomez, Roger David, PA-C  cephALEXin (KEFLEX) 500 MG capsule Take 1 capsule (500 mg total) by mouth 3 (three) times daily. 01/23/18   Fayrene Helperran, Bowie, PA-C  colchicine 0.6 MG tablet Take 1 tablet (0.6 mg total) by mouth daily for 5 days. 12/28/17 01/02/18  Michela PitcherFawze, Mina A, PA-C  ibuprofen (ADVIL,MOTRIN) 600 MG tablet Take 1 tablet (600 mg total) by mouth every 6 (six) hours as needed. 01/23/18   Fayrene Helperran, Bowie, PA-C  naproxen (NAPROSYN) 500 MG tablet Take 1 tablet (500 mg total) by mouth 2 (two) times daily. 12/28/17   Luevenia MaxinFawze, Mina A, PA-C  ondansetron (ZOFRAN ODT) 4 MG disintegrating tablet Take 1 tablet (4 mg total) by mouth every 8 (eight) hours as needed for nausea or vomiting. 11/16/17   Street, BoothMercedes, PA-C    sulfamethoxazole-trimethoprim (BACTRIM DS,SEPTRA DS) 800-160 MG tablet Take 1 tablet by mouth 2 (two) times daily for 7 days. 01/23/18 01/30/18  Fayrene Helperran, Bowie, PA-C    Family History No family history on file.  Social History Social History   Tobacco Use  . Smoking status: Never Smoker  . Smokeless tobacco: Never Used  Substance Use Topics  . Alcohol use: No    Comment: socially   . Drug use: No     Allergies   Patient has no known allergies.   Review of Systems Review of Systems  Musculoskeletal: Positive for arthralgias and joint swelling.  Skin: Positive for color change.       pus  All other systems reviewed and are negative.    Physical Exam Updated Vital Signs BP (!) 168/93 (BP Location: Right Arm)   Pulse 71   Temp 98.2 F (36.8 C) (Oral)   Resp 15   Ht 5\' 9"  (1.753 m)   Wt 82.6 kg (182 lb)   SpO2 100%   BMI 26.88 kg/m   Physical Exam  Constitutional: He is oriented to person, place, and time. He appears well-developed and well-nourished. No distress.  HENT:  Head: Normocephalic and atraumatic.  Eyes: Conjunctivae and EOM are normal.  Neck: Normal range of motion.  Cardiovascular: Normal rate and regular rhythm.  Pulmonary/Chest: Effort normal and breath sounds  normal.  Musculoskeletal: Normal range of motion. He exhibits edema and tenderness. He exhibits no deformity.  Diffuse edema to the left thumb from the fingertip to [facility] with warmth, fluctuance mostly at the fingertip.  Exquisitely tender.  Patient unable to actively move the finger.  Pain with passive range of motion.  Tenderness along ventral and dorsal distribution of tendon into the metacarpal and thenar prominence.  No focal bony tenderness to wrist bones. No scaphoid tenderness. See picture.   Neurological: He is alert and oriented to person, place, and time.  Skin: Skin is warm and dry. Capillary refill takes less than 2 seconds. There is erythema.  Psychiatric: He has a normal mood and  affect. His behavior is normal. Judgment and thought content normal.  Nursing note and vitals reviewed.          ED Treatments / Results  Labs (all labs ordered are listed, but only abnormal results are displayed) Labs Reviewed  CBC WITH DIFFERENTIAL/PLATELET  BASIC METABOLIC PANEL  I-STAT CG4 LACTIC ACID, ED    EKG None  Radiology Dg Finger Thumb Left  Result Date: 01/26/2018 CLINICAL DATA:  Wound on thumb. EXAM: LEFT THUMB 2+V COMPARISON:  None. FINDINGS: There is mild increased attenuation over the distal soft tissues which is favored to represent topical treatment, not seen on the previous study. Minimal lucency adjacent to the high attenuation is mostly soft tissue edema. It would be difficult to exclude a small amount of gas in the wound or immediately adjacent to the wound. There is no definitive bony erosion. On the oblique image, there is slight rarefication of underlying bone which is favored to be artifactual rather than the earliest stage of bony erosion. Recommend close attention on follow-up. IMPRESSION: 1. Mild high attenuation over the distal soft tissues is likely topical treatment. 2. Lucency in the adjacent soft tissues is mostly edema. It would be difficult to exclude a small amount of air in the wound in the medial adjacent soft tissues. 3. No definitive bony erosion. A subtle region of rarefication on only 1 image is favored to be artifactual rather than the earliest stages of bony erosion. Recommend attention on follow-up. An MRI could of course better evaluate for subtle osteomyelitis. Electronically Signed   By: Gerome Sam III M.D   On: 01/26/2018 07:44    Procedures Procedures (including critical care time)  Medications Ordered in ED Medications  morphine 4 MG/ML injection 4 mg (has no administration in time range)     Initial Impression / Assessment and Plan / ED Course  I have reviewed the triage vital signs and the nursing notes.  Pertinent  labs & imaging results that were available during my care of the patient were reviewed by me and considered in my medical decision making (see chart for details).    Differential diagnosis includes infectious tenosynovitis, worsening felon, septic arthritis, osteomyelitis less likely gout.  No history of immunosuppression or IV drug use.  Symptoms have been refractory to incision and drainage in the ER, Keflex and Bactrim.  He denies constitutional symptoms.  Given progression, will obtain screening labs, x-ray, lactic acid.  Anticipate hand surgery consult.  0800: X-ray reviewed.  Discussed patient with Dr. Dion Saucier who anticipates patient will need OR, will see patient in ER.  Pending labs.   0815: Dr Dion Saucier called back after reviewing x-rays, he will take patient to the OR later this morning.  Hold off on antibiotics.  Patient updated on plan.  He is in  agreement. Final Clinical Impressions(s) / ED Diagnoses   Final diagnoses:  Tenosynovitis of finger    ED Discharge Orders    None       Liberty Handy, PA-C 01/26/18 1610    Jacalyn Lefevre, MD 01/26/18 [phone]

## 2018-01-26 NOTE — Anesthesia Procedure Notes (Signed)
Procedure Name: LMA Insertion Date/Time: 01/26/2018 10:22 AM Performed by: Dorie RankQuinn, Taijuan Serviss M, CRNA Pre-anesthesia Checklist: Patient identified, Emergency Drugs available, Suction available, Patient being monitored and Timeout performed Patient Re-evaluated:Patient Re-evaluated prior to induction Oxygen Delivery Method: Circle system utilized Preoxygenation: Pre-oxygenation with 100% oxygen Induction Type: IV induction Ventilation: Mask ventilation without difficulty LMA: LMA inserted LMA Size: 4.0 Number of attempts: 1 Placement Confirmation: ETT inserted through vocal cords under direct vision,  positive ETCO2 and breath sounds checked- equal and bilateral Tube secured with: Tape Dental Injury: Teeth and Oropharynx as per pre-operative assessment

## 2018-01-26 NOTE — Anesthesia Preprocedure Evaluation (Addendum)
Anesthesia Evaluation  Patient identified by MRN, date of birth, ID band Patient awake    Reviewed: Allergy & Precautions, NPO status , Patient's Chart, lab work & pertinent test results  Airway Mallampati: II  TM Distance: >3 FB Neck ROM: Full    Dental  (+) Dental Advisory Given   Pulmonary neg pulmonary ROS,    breath sounds clear to auscultation       Cardiovascular negative cardio ROS   Rhythm:Regular Rate:Normal     Neuro/Psych negative neurological ROS     GI/Hepatic negative GI ROS, Neg liver ROS,   Endo/Other  Gout  Renal/GU negative Renal ROS     Musculoskeletal   Abdominal   Peds  Hematology negative hematology ROS (+)   Anesthesia Other Findings   Reproductive/Obstetrics                             Anesthesia Physical Anesthesia Plan  ASA: II and emergent  Anesthesia Plan: General   Post-op Pain Management:    Induction: Intravenous  PONV Risk Score and Plan: 2 and Ondansetron, Dexamethasone and Treatment may vary due to age or medical condition  Airway Management Planned: LMA  Additional Equipment:   Intra-op Plan:   Post-operative Plan: Extubation in OR  Informed Consent: I have reviewed the patients History and Physical, chart, labs and discussed the procedure including the risks, benefits and alternatives for the proposed anesthesia with the patient or authorized representative who has indicated his/her understanding and acceptance.     Plan Discussed with:   Anesthesia Plan Comments:         Anesthesia Quick Evaluation

## 2018-01-26 NOTE — Progress Notes (Signed)
Cell phone retrieved from security and returned to patient.

## 2018-01-26 NOTE — Op Note (Signed)
01/26/2018  11:03 AM  PATIENT:  Brian Erickson    PRE-OPERATIVE DIAGNOSIS:   Left thumb infection, felon, possible flexor tendon synovitis  POST-OPERATIVE DIAGNOSIS:  Same   PROCEDURE:   1.  Left thumb irrigation, incision, debridement, flexor tendon synovitis 2.  Left thumb incision, irrigation, debridement, felon infection of the thumb pulp.  SURGEON:  Eulas PostJoshua P Erline Siddoway, MD  PHYSICIAN ASSISTANT: None  ANESTHESIA:   General  PREOPERATIVE INDICATIONS:  Brian Erickson is a  52 y.o. male who had a left thumb infection that was persistent, progressive, for the past week or so.  He had in the emergency room incision and debridement, that failed to improve symptoms, and had worsening swelling and loss of function of the thumb.  The risks benefits and alternatives were discussed with the patient preoperatively including but not limited to the risks of infection, bleeding, nerve injury, cardiopulmonary complications, the need for revision surgery, among others, and the patient was willing to proceed.  ESTIMATED BLOOD LOSS: 25 mL  OPERATIVE IMPLANTS: None  OPERATIVE FINDINGS: Upon release of the A1 pulley I did get a small blush of fluid although it was not frank pus.  There was frank pus involving the thumb pulp, extending down to the level of the phalanx.  OPERATIVE PROCEDURE: The patient was brought to the operating room and placed in the supine position.  General anesthesia was administered.  The left upper extremity was prepped and draped in usual sterile fashion.  Timeout performed.  I started with the cleanest portion of the case, exploring the A1 pulley.  Transverse incision was made over the crease of the thumb overlying the A1 pulley.  Blunt dissection was carried down, taking care to protect the neurovascular structures.  They looked like a vein that was immediately subcutaneous, I do not think it was the nerve, this was partially nicked, and retracted radially.  Dissection was  carried down to the flexor tendon at the A1 pulley and the pulley was incised, a culture was taken of the fluid that came out of the tendon sheath.  I inserted a 5 JamaicaFrench feeding tube into the thumb, up the tendon sheath, and irrigated this copiously with saline.  I then went to the thumb, and made a midline incision over the area of fluctuance.  Blunt dissection was carried out and a substantial amount of pus was encountered.  This was collected within a cup, sent to the laboratory for Gram stain culture and sensitivity.  I used a knife, scissors, rongure, as well as a curette.  After complete debridement was carried out I irrigated the thumb tip copiously.  I injected some more fluid along the tendon sheath to see if it communicated but it did not seem to communicate.  After complete irrigation was carried out I dressed the wounds with Xeroform and then placed sterile gauze followed by a thumb spica splint.  He was awakened, tourniquet released, and return to the PACU in stable and satisfactory condition.  There were no complications and he tolerated the procedure well.

## 2018-01-26 NOTE — H&P (Signed)
PREOPERATIVE H&P  Chief Complaint: left thumb infection  HPI: Brian Erickson is a 52 y.o. male who presents for preoperative history and physical with a diagnosis of left thumb infection. Symptoms are rated as moderate to severe, and have been worsening.  This is significantly impairing activities of daily living.  He has elected for surgical management. He has had an I&D in ED 3 days ago.  Pain has been worsening.  He was working on a house, and is not sure if he had any puncture wounds.  He has had a previous fracture of this thumb about a year ago that affected his range of motion.  Past Medical History:  Diagnosis Date  . Gout    History reviewed. No pertinent surgical history. Social History   Socioeconomic History  . Marital status: Single    Spouse name: Not on file  . Number of children: Not on file  . Years of education: Not on file  . Highest education level: Not on file  Occupational History  . Not on file  Social Needs  . Financial resource strain: Not on file  . Food insecurity:    Worry: Not on file    Inability: Not on file  . Transportation needs:    Medical: Not on file    Non-medical: Not on file  Tobacco Use  . Smoking status: Never Smoker  . Smokeless tobacco: Never Used  Substance and Sexual Activity  . Alcohol use: No    Comment: socially   . Drug use: No  . Sexual activity: Not on file  Lifestyle  . Physical activity:    Days per week: Not on file    Minutes per session: Not on file  . Stress: Not on file  Relationships  . Social connections:    Talks on phone: Not on file    Gets together: Not on file    Attends religious service: Not on file    Active member of club or organization: Not on file    Attends meetings of clubs or organizations: Not on file    Relationship status: Not on file  Other Topics Concern  . Not on file  Social History Narrative  . Not on file   History reviewed. No pertinent family history. No Known  Allergies Prior to Admission medications   Medication Sig Start Date End Date Taking? Authorizing Provider  allopurinol (ZYLOPRIM) 300 MG tablet Take 1 tablet (300 mg total) by mouth daily. 09/18/17   Loletta SpecterGomez, Roger David, PA-C  cephALEXin (KEFLEX) 500 MG capsule Take 1 capsule (500 mg total) by mouth 3 (three) times daily. 01/23/18   Fayrene Helperran, Bowie, PA-C  colchicine 0.6 MG tablet Take 1 tablet (0.6 mg total) by mouth daily for 5 days. 12/28/17 01/02/18  Michela PitcherFawze, Mina A, PA-C  ibuprofen (ADVIL,MOTRIN) 600 MG tablet Take 1 tablet (600 mg total) by mouth every 6 (six) hours as needed. 01/23/18   Fayrene Helperran, Bowie, PA-C  naproxen (NAPROSYN) 500 MG tablet Take 1 tablet (500 mg total) by mouth 2 (two) times daily. 12/28/17   Luevenia MaxinFawze, Mina A, PA-C  ondansetron (ZOFRAN ODT) 4 MG disintegrating tablet Take 1 tablet (4 mg total) by mouth every 8 (eight) hours as needed for nausea or vomiting. 11/16/17   Street, De KalbMercedes, PA-C  sulfamethoxazole-trimethoprim (BACTRIM DS,SEPTRA DS) 800-160 MG tablet Take 1 tablet by mouth 2 (two) times daily for 7 days. 01/23/18 01/30/18  Fayrene Helperran, Bowie, PA-C     Positive ROS: All other systems have been  reviewed and were otherwise negative with the exception of those mentioned in the HPI and as above.  Physical Exam: General: Alert, no acute distress Cardiovascular: No pedal edema Respiratory: No cyanosis, no use of accessory musculature GI: No organomegaly, abdomen is soft and non-tender Skin: No lesions in the area of chief complaint Neurologic: Sensation intact distally Psychiatric: Patient is competent for consent with normal mood and affect Lymphatic: No axillary or cervical lymphadenopathy  MUSCULOSKELETAL: Left thumb has significant soft tissue swelling, positive purulence distally, mild pain to palpation along the Lexer tendon, fixed position of the thumb, painful range of motion that is very limited.  He gets a little bit of pain in his webspace.  Assessment: Left thumb felon, possible  flexor tendon synovitis.   Plan: Plan for Procedure(s): IRRIGATION AND DEBRIDEMENT LEFT THUMB  The risks benefits and alternatives were discussed with the patient including not limited the risks of recurrent infection, the need for revision surgery, incomplete relief of pain, damage to the flexor tendon, nerve injury, infection in the bone, among others.  He is willing to proceed.   Eulas Post, MD Cell [phone]   01/26/2018 9:55 AM

## 2018-01-27 ENCOUNTER — Encounter (HOSPITAL_COMMUNITY): Payer: Self-pay | Admitting: Orthopedic Surgery

## 2018-01-28 ENCOUNTER — Other Ambulatory Visit (INDEPENDENT_AMBULATORY_CARE_PROVIDER_SITE_OTHER): Payer: Self-pay | Admitting: Physician Assistant

## 2018-01-28 ENCOUNTER — Telehealth (INDEPENDENT_AMBULATORY_CARE_PROVIDER_SITE_OTHER): Payer: Self-pay

## 2018-01-28 MED ORDER — SULFAMETHOXAZOLE-TRIMETHOPRIM 800-160 MG PO TABS: 1.0000 | ORAL_TABLET | Freq: Two times a day (BID) | ORAL | 0 refills | Status: AC

## 2018-01-28 NOTE — Telephone Encounter (Signed)
-----   Message from Loletta Specteroger David Gomez, PA-C sent at 01/28/2018  5:19 PM EDT ----- Please let him know I have sent Bactrim to [facility].

## 2018-01-28 NOTE — Telephone Encounter (Signed)
Patient aware that abx has been sent to [facility]. Maryjean Mornempestt S Roberts, CMA

## 2018-01-29 MED FILL — SULFAMETHOXAZOLE-TMP DS TAB: 800-160 | 10 days supply | Qty: 20 | Fill #0

## 2018-02-01 LAB — AEROBIC/ANAEROBIC CULTURE W GRAM STAIN (SURGICAL/DEEP WOUND)
Culture: NO GROWTH
Gram Stain: NONE SEEN

## 2018-02-01 LAB — AEROBIC/ANAEROBIC CULTURE (SURGICAL/DEEP WOUND)

## 2018-02-11 ENCOUNTER — Other Ambulatory Visit: Payer: Self-pay | Admitting: Orthopedic Surgery

## 2018-02-11 ENCOUNTER — Encounter (HOSPITAL_BASED_OUTPATIENT_CLINIC_OR_DEPARTMENT_OTHER): Payer: Self-pay | Admitting: *Deleted

## 2018-02-11 ENCOUNTER — Other Ambulatory Visit: Payer: Self-pay

## 2018-02-11 DIAGNOSIS — L03012 Cellulitis of left finger: Secondary | ICD-10-CM

## 2018-02-11 NOTE — Progress Notes (Signed)
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 [facility].

## 2018-02-12 ENCOUNTER — Encounter (HOSPITAL_BASED_OUTPATIENT_CLINIC_OR_DEPARTMENT_OTHER)
Admission: RE | Disposition: A | Discharge status: Home / Self Care | Payer: Self-pay | Source: Ambulatory Visit | Attending: Orthopedic Surgery

## 2018-02-12 ENCOUNTER — Ambulatory Visit (HOSPITAL_BASED_OUTPATIENT_CLINIC_OR_DEPARTMENT_OTHER)
Admission: RE | Admit: 2018-02-12 | Discharge: 2018-02-12 | Disposition: A | Discharge status: Home / Self Care | Payer: Self-pay | Source: Ambulatory Visit | Attending: Orthopedic Surgery | Admitting: Orthopedic Surgery

## 2018-02-12 ENCOUNTER — Encounter (HOSPITAL_BASED_OUTPATIENT_CLINIC_OR_DEPARTMENT_OTHER): Payer: Self-pay

## 2018-02-12 ENCOUNTER — Ambulatory Visit (HOSPITAL_BASED_OUTPATIENT_CLINIC_OR_DEPARTMENT_OTHER): Admit: 2018-02-12 | Payer: Self-pay | Admitting: Orthopedic Surgery

## 2018-02-12 ENCOUNTER — Other Ambulatory Visit: Payer: Self-pay | Admitting: Orthopedic Surgery

## 2018-02-12 ENCOUNTER — Ambulatory Visit (HOSPITAL_COMMUNITY)
Admission: RE | Admit: 2018-02-12 | Discharge: 2018-02-12 | Disposition: A | Discharge status: Home / Self Care | Payer: Medicaid Other | Source: Ambulatory Visit | Attending: Orthopedic Surgery | Admitting: Orthopedic Surgery

## 2018-02-12 ENCOUNTER — Ambulatory Visit (HOSPITAL_BASED_OUTPATIENT_CLINIC_OR_DEPARTMENT_OTHER): Payer: Self-pay | Admitting: Anesthesiology

## 2018-02-12 ENCOUNTER — Encounter (HOSPITAL_BASED_OUTPATIENT_CLINIC_OR_DEPARTMENT_OTHER): Payer: Self-pay | Admitting: Anesthesiology

## 2018-02-12 DIAGNOSIS — K219 Gastro-esophageal reflux disease without esophagitis: Secondary | ICD-10-CM | POA: Insufficient documentation

## 2018-02-12 DIAGNOSIS — M869 Osteomyelitis, unspecified: Secondary | ICD-10-CM

## 2018-02-12 DIAGNOSIS — L089 Local infection of the skin and subcutaneous tissue, unspecified: Secondary | ICD-10-CM | POA: Insufficient documentation

## 2018-02-12 DIAGNOSIS — L03012 Cellulitis of left finger: Secondary | ICD-10-CM

## 2018-02-12 DIAGNOSIS — I878 Other specified disorders of veins: Secondary | ICD-10-CM | POA: Insufficient documentation

## 2018-02-12 DIAGNOSIS — M109 Gout, unspecified: Secondary | ICD-10-CM | POA: Insufficient documentation

## 2018-02-12 DIAGNOSIS — Z792 Long term (current) use of antibiotics: Secondary | ICD-10-CM | POA: Insufficient documentation

## 2018-02-12 DIAGNOSIS — A4102 Sepsis due to Methicillin resistant Staphylococcus aureus: Secondary | ICD-10-CM | POA: Insufficient documentation

## 2018-02-12 DIAGNOSIS — M868X4 Other osteomyelitis, hand: Secondary | ICD-10-CM | POA: Insufficient documentation

## 2018-02-12 HISTORY — DX: Local infection of the skin and subcutaneous tissue, unspecified: L08.9

## 2018-02-12 HISTORY — PX: I&D EXTREMITY: SHX5045

## 2018-02-12 HISTORY — DX: Osteomyelitis, unspecified: M86.9

## 2018-02-12 HISTORY — DX: Gastro-esophageal reflux disease without esophagitis: K21.9

## 2018-02-12 HISTORY — DX: Carrier or suspected carrier of methicillin resistant Staphylococcus aureus: Z22.322

## 2018-02-12 SURGERY — IRRIGATION AND DEBRIDEMENT EXTREMITY
Anesthesia: General | Site: Hand | Laterality: Left

## 2018-02-12 SURGERY — IRRIGATION AND DEBRIDEMENT EXTREMITY
Anesthesia: General | Laterality: Left

## 2018-02-12 MED ORDER — PROPOFOL 10 MG/ML IV BOLUS
INTRAVENOUS | Status: AC
  Filled 2018-02-12: qty 20

## 2018-02-12 MED ORDER — FENTANYL CITRATE (PF) 100 MCG/2ML IJ SOLN
INTRAMUSCULAR | Status: DC | PRN
  Administered 2018-02-12: 50 ug via INTRAVENOUS

## 2018-02-12 MED ORDER — ACETAMINOPHEN 500 MG PO TABS
ORAL_TABLET | ORAL | Status: AC
  Filled 2018-02-12: qty 2

## 2018-02-12 MED ORDER — HYDROCODONE-ACETAMINOPHEN 5-325 MG PO TABS: 1.0000 | ORAL_TABLET | Freq: Four times a day (QID) | ORAL | 0 refills | Status: DC | PRN

## 2018-02-12 MED ORDER — PHENYLEPHRINE HCL 10 MG/ML IJ SOLN
INTRAMUSCULAR | Status: DC | PRN
  Administered 2018-02-12 (×4): 80 ug via INTRAVENOUS

## 2018-02-12 MED ORDER — GLYCOPYRROLATE PF 0.2 MG/ML IJ SOSY
PREFILLED_SYRINGE | INTRAMUSCULAR | Status: AC
  Filled 2018-02-12: qty 1

## 2018-02-12 MED ORDER — LIDOCAINE HCL 1 % IJ SOLN
INTRAMUSCULAR | Status: AC
  Filled 2018-02-12: qty 20

## 2018-02-12 MED ORDER — VANCOMYCIN HCL 10 G IV SOLR
1250.0000 mg | Freq: Once | INTRAVENOUS | Status: AC
  Administered 2018-02-12: 1250 mg via INTRAVENOUS

## 2018-02-12 MED ORDER — CHLORHEXIDINE GLUCONATE 4 % EX LIQD: 60.0000 mL | Freq: Once | CUTANEOUS | Status: DC

## 2018-02-12 MED ORDER — FENTANYL CITRATE (PF) 100 MCG/2ML IJ SOLN
INTRAMUSCULAR | Status: AC
  Filled 2018-02-12: qty 2

## 2018-02-12 MED ORDER — SODIUM CHLORIDE 0.9 % IJ SOLN
INTRAMUSCULAR | Status: AC
  Filled 2018-02-12: qty 10

## 2018-02-12 MED ORDER — PROPOFOL 10 MG/ML IV BOLUS
INTRAVENOUS | Status: DC | PRN
  Administered 2018-02-12: 150 mg via INTRAVENOUS

## 2018-02-12 MED ORDER — DEXAMETHASONE SODIUM PHOSPHATE 4 MG/ML IJ SOLN
INTRAMUSCULAR | Status: DC | PRN
  Administered 2018-02-12: 10 mg via INTRAVENOUS

## 2018-02-12 MED ORDER — ACETAMINOPHEN 500 MG PO TABS
1000.0000 mg | ORAL_TABLET | Freq: Once | ORAL | Status: AC
  Administered 2018-02-12: 1000 mg via ORAL

## 2018-02-12 MED ORDER — HEPARIN SOD (PORK) LOCK FLUSH 100 UNIT/ML IV SOLN
250.0000 [IU] | INTRAVENOUS | Status: AC | PRN
  Administered 2018-02-12: 250 [IU]

## 2018-02-12 MED ORDER — PROMETHAZINE HCL 25 MG/ML IJ SOLN: 6.2500 mg | INTRAMUSCULAR | Status: DC | PRN

## 2018-02-12 MED ORDER — VANCOMYCIN HCL 10 G IV SOLR: 1250.0000 mg | Freq: Once | INTRAVENOUS | 0 refills | Status: AC

## 2018-02-12 MED ORDER — DEXAMETHASONE SODIUM PHOSPHATE 10 MG/ML IJ SOLN
INTRAMUSCULAR | Status: AC
  Filled 2018-02-12: qty 1

## 2018-02-12 MED ORDER — MIDAZOLAM HCL 2 MG/2ML IJ SOLN: 1.0000 mg | INTRAMUSCULAR | Status: DC | PRN

## 2018-02-12 MED ORDER — LACTATED RINGERS IV SOLN
INTRAVENOUS | Status: DC
  Administered 2018-02-12: 11:00:00 via INTRAVENOUS

## 2018-02-12 MED ORDER — FENTANYL CITRATE (PF) 100 MCG/2ML IJ SOLN: 25.0000 ug | INTRAMUSCULAR | Status: DC | PRN

## 2018-02-12 MED ORDER — POVIDONE-IODINE 10 % EX SWAB: 2.0000 "application " | Freq: Once | CUTANEOUS | Status: DC

## 2018-02-12 MED ORDER — VANCOMYCIN HCL IN DEXTROSE 1-5 GM/200ML-% IV SOLN
INTRAVENOUS | Status: AC
  Filled 2018-02-12: qty 200

## 2018-02-12 MED ORDER — SCOPOLAMINE 1 MG/3DAYS TD PT72: 1.0000 | MEDICATED_PATCH | Freq: Once | TRANSDERMAL | Status: DC | PRN

## 2018-02-12 MED ORDER — MIDAZOLAM HCL 5 MG/5ML IJ SOLN
INTRAMUSCULAR | Status: DC | PRN
  Administered 2018-02-12: 1 mg via INTRAVENOUS

## 2018-02-12 MED ORDER — ONDANSETRON HCL 4 MG/2ML IJ SOLN
INTRAMUSCULAR | Status: AC
  Filled 2018-02-12: qty 2

## 2018-02-12 MED ORDER — EPHEDRINE SULFATE 50 MG/ML IJ SOLN
INTRAMUSCULAR | Status: AC
  Filled 2018-02-12: qty 1

## 2018-02-12 MED ORDER — VANCOMYCIN HCL IN DEXTROSE 500-5 MG/100ML-% IV SOLN
INTRAVENOUS | Status: AC
  Filled 2018-02-12: qty 100

## 2018-02-12 MED ORDER — MIDAZOLAM HCL 2 MG/2ML IJ SOLN
INTRAMUSCULAR | Status: AC
  Filled 2018-02-12: qty 2

## 2018-02-12 MED ORDER — EPHEDRINE SULFATE 50 MG/ML IJ SOLN
INTRAMUSCULAR | Status: DC | PRN
  Administered 2018-02-12 (×2): 25 mg via INTRAVENOUS

## 2018-02-12 MED ORDER — FENTANYL CITRATE (PF) 100 MCG/2ML IJ SOLN: 50.0000 ug | INTRAMUSCULAR | Status: DC | PRN

## 2018-02-12 MED ORDER — LIDOCAINE HCL (PF) 1 % IJ SOLN
INTRAMUSCULAR | Status: DC | PRN
  Administered 2018-02-12: 5 mL

## 2018-02-12 MED ORDER — GLYCOPYRROLATE 0.2 MG/ML IJ SOLN
INTRAMUSCULAR | Status: DC | PRN
  Administered 2018-02-12: 0.1 mg via INTRAVENOUS

## 2018-02-12 MED ORDER — BUPIVACAINE HCL (PF) 0.25 % IJ SOLN
INTRAMUSCULAR | Status: AC
  Filled 2018-02-12: qty 30

## 2018-02-12 MED ORDER — PHENYLEPHRINE 40 MCG/ML (10ML) SYRINGE FOR IV PUSH (FOR BLOOD PRESSURE SUPPORT)
PREFILLED_SYRINGE | INTRAVENOUS | Status: AC
  Filled 2018-02-12: qty 10

## 2018-02-12 SURGICAL SUPPLY — 64 items
BANDAGE ACE 4X5 VEL STRL LF (GAUZE/BANDAGES/DRESSINGS) ×1 IMPLANT
BANDAGE ACE 6X5 VEL STRL LF (GAUZE/BANDAGES/DRESSINGS) IMPLANT
BANDAGE ESMARK 6X9 LF (GAUZE/BANDAGES/DRESSINGS) ×1 IMPLANT
BLADE SURG 15 STRL LF DISP TIS (BLADE) ×1 IMPLANT
BLADE SURG 15 STRL SS (BLADE) ×2
BNDG CMPR 9X6 STRL LF SNTH (GAUZE/BANDAGES/DRESSINGS) ×1
BNDG COHESIVE 1X5 TAN STRL LF (GAUZE/BANDAGES/DRESSINGS) ×1 IMPLANT
BNDG COHESIVE 4X5 TAN STRL (GAUZE/BANDAGES/DRESSINGS) ×2 IMPLANT
BNDG ESMARK 6X9 LF (GAUZE/BANDAGES/DRESSINGS) ×2
BRUSH SCRUB EZ PLAIN DRY (MISCELLANEOUS) IMPLANT
CANISTER SUCT 1200ML W/VALVE (MISCELLANEOUS) ×2 IMPLANT
CLSR STERI-STRIP ANTIMIC 1/2X4 (GAUZE/BANDAGES/DRESSINGS) IMPLANT
COVER BACK TABLE 60X90IN (DRAPES) ×2 IMPLANT
CUFF TOURNIQUET SINGLE 18IN (TOURNIQUET CUFF) ×2 IMPLANT
CUFF TOURNIQUET SINGLE 34IN LL (TOURNIQUET CUFF) IMPLANT
DECANTER SPIKE VIAL GLASS SM (MISCELLANEOUS) IMPLANT
DRAPE EXTREMITY T 121X128X90 (DRAPE) ×2 IMPLANT
DRAPE IMP U-DRAPE 54X76 (DRAPES) ×2 IMPLANT
DRAPE INCISE IOBAN 66X45 STRL (DRAPES) IMPLANT
DRAPE OEC MINIVIEW 54X84 (DRAPES) IMPLANT
DRAPE U-SHAPE 47X51 STRL (DRAPES) ×2 IMPLANT
DRSG PAD ABDOMINAL 8X10 ST (GAUZE/BANDAGES/DRESSINGS) IMPLANT
DURAPREP 26ML APPLICATOR (WOUND CARE) ×1 IMPLANT
ELECT REM PT RETURN 9FT ADLT (ELECTROSURGICAL)
ELECTRODE REM PT RTRN 9FT ADLT (ELECTROSURGICAL) IMPLANT
GAUZE PACKING IODOFORM 1/4X15 (GAUZE/BANDAGES/DRESSINGS) ×2 IMPLANT
GAUZE SPONGE 4X4 12PLY STRL (GAUZE/BANDAGES/DRESSINGS) ×2 IMPLANT
GLOVE BIO SURGEON STRL SZ8 (GLOVE) ×2 IMPLANT
GLOVE BIO SURGEON STRL SZ8.5 (GLOVE) ×2 IMPLANT
GLOVE BIOGEL PI IND STRL 8 (GLOVE) ×2 IMPLANT
GLOVE BIOGEL PI INDICATOR 8 (GLOVE) ×2
GLOVE ORTHO TXT STRL SZ7.5 (GLOVE) ×2 IMPLANT
GOWN STRL REUS W/ TWL LRG LVL3 (GOWN DISPOSABLE) ×1 IMPLANT
GOWN STRL REUS W/ TWL XL LVL3 (GOWN DISPOSABLE) ×2 IMPLANT
GOWN STRL REUS W/TWL LRG LVL3 (GOWN DISPOSABLE) ×2
GOWN STRL REUS W/TWL XL LVL3 (GOWN DISPOSABLE) ×6
HANDPIECE INTERPULSE COAX TIP (DISPOSABLE)
NDL HYPO 25X1 1.5 SAFETY (NEEDLE) IMPLANT
NEEDLE HYPO 25X1 1.5 SAFETY (NEEDLE) ×2 IMPLANT
NS IRRIG 1000ML POUR BTL (IV SOLUTION) ×2 IMPLANT
PACK BASIN DAY SURGERY FS (CUSTOM PROCEDURE TRAY) ×2 IMPLANT
PAD CAST 4YDX4 CTTN HI CHSV (CAST SUPPLIES) ×1 IMPLANT
PADDING CAST COTTON 4X4 STRL (CAST SUPPLIES) ×2
PENCIL BUTTON HOLSTER BLD 10FT (ELECTRODE) IMPLANT
SET HNDPC FAN SPRY TIP SCT (DISPOSABLE) IMPLANT
SLEEVE SCD COMPRESS KNEE MED (MISCELLANEOUS) ×1 IMPLANT
SPLINT FAST PLASTER 5X30 (CAST SUPPLIES)
SPLINT PLASTER CAST FAST 5X30 (CAST SUPPLIES) IMPLANT
SPONGE LAP 18X18 RF (DISPOSABLE) IMPLANT
SPONGE LAP 4X18 RFD (DISPOSABLE) ×2 IMPLANT
STAPLER VISISTAT 35W (STAPLE) IMPLANT
SUT ETHILON 3 0 PS 1 (SUTURE) IMPLANT
SUT ETHILON 4 0 PS 2 18 (SUTURE) IMPLANT
SUT MNCRL AB 4-0 PS2 18 (SUTURE) IMPLANT
SUT VIC AB 0 CT1 27 (SUTURE)
SUT VIC AB 0 CT1 27XBRD ANBCTR (SUTURE) IMPLANT
SUT VIC AB 2-0 SH 18 (SUTURE) IMPLANT
SUT VIC AB 3-0 SH 27 (SUTURE)
SUT VIC AB 3-0 SH 27X BRD (SUTURE) IMPLANT
SUT VICRYL 3-0 CR8 SH (SUTURE) IMPLANT
SUT VICRYL 4-0 PS2 18IN ABS (SUTURE) IMPLANT
SWAB COLLECTION DEVICE MRSA (MISCELLANEOUS) ×2 IMPLANT
SWAB CULTURE ESWAB REG 1ML (MISCELLANEOUS) ×2 IMPLANT
SYR BULB 3OZ (MISCELLANEOUS) ×2 IMPLANT

## 2018-02-12 NOTE — Discharge Instructions (Signed)
Change the dressing tomorrow, remove the packing from the backside of the thumb, plan to change her dressing on Friday with Dr. Dion SaucierLandau and we will remove the packing and repacked the dressing on the front side of the thumb.  Bring the iodoform gauze given to you from the surgery center to your appointment.    Call your surgeon if you experience:   1.  Fever over 101.0. 2.  Inability to urinate. 3.  Nausea and/or vomiting. 4.  Extreme swelling or bruising at the surgical site. 5.  Continued bleeding from the incision. 6.  Increased pain, redness or drainage from the incision. 7.  Problems related to your pain medication. 8.  Any problems and/or concerns   Post Anesthesia Home Care Instructions  Activity: Get plenty of rest for the remainder of the day. A responsible individual must stay with you for 24 hours following the procedure.  For the next 24 hours, DO NOT: -Drive a car -Advertising copywriterperate machinery -Drink alcoholic beverages -Take any medication unless instructed by your physician -Make any legal decisions or sign important papers.  Meals: Start with liquid foods such as gelatin or soup. Progress to regular foods as tolerated. Avoid greasy, spicy, heavy foods. If nausea and/or vomiting occur, drink only clear liquids until the nausea and/or vomiting subsides. Call your physician if vomiting continues.  Special Instructions/Symptoms: Your throat may feel dry or sore from the anesthesia or the breathing tube placed in your throat during surgery. If this causes discomfort, gargle with warm salt water. The discomfort should disappear within 24 hours.  If you had a scopolamine patch placed behind your ear for the management of post- operative nausea and/or vomiting:  1. The medication in the patch is effective for 72 hours, after which it should be removed.  Wrap patch in a tissue and discard in the trash. Wash hands thoroughly with soap and water. 2. You may remove the patch earlier than  72 hours if you experience unpleasant side effects which may include dry mouth, dizziness or visual disturbances. 3. Avoid touching the patch. Wash your hands with soap and water after contact with the patch.

## 2018-02-12 NOTE — H&P (Signed)
PREOPERATIVE H&P  Chief Complaint: Left thumb infection  HPI: Brian Erickson is a 52 y.o. male who has had persistent infection in the left thumb.  He underwent surgical I&D, diagnosed with MRSA, he has been on doxycycline, recently added Bactrim.  The palmar aspect of the thumb is gotten better, and I also debrided the flexor tendon sheath, which looked okay, but he has had persistent dorsal swelling and redness, concerning for persistent infection.  X-rays taken yesterday in the office demonstrate increased osteolysis, concerning for osteomyelitis.  Past Medical History:  Diagnosis Date  . Felon of finger of left hand 01/26/2018  . GERD (gastroesophageal reflux disease)   . Gout   . Infection of thumb    left +MRSA  . MRSA (methicillin resistant staph aureus) culture positive    current infection left thumb   Past Surgical History:  Procedure Laterality Date  . I&D EXTREMITY Left 01/26/2018   Procedure: IRRIGATION AND DEBRIDEMENT LEFT THUMB;  Surgeon: Teryl Lucy, MD;  Location: [facility] OR;  Service: Orthopedics;  Laterality: Left;   Social History   Socioeconomic History  . Marital status: Single    Spouse name: Not on file  . Number of children: Not on file  . Years of education: Not on file  . Highest education level: Not on file  Occupational History  . Not on file  Social Needs  . Financial resource strain: Not on file  . Food insecurity:    Worry: Not on file    Inability: Not on file  . Transportation needs:    Medical: Not on file    Non-medical: Not on file  Tobacco Use  . Smoking status: Never Smoker  . Smokeless tobacco: Never Used  Substance and Sexual Activity  . Alcohol use: No    Comment: socially   . Drug use: No  . Sexual activity: Not on file  Lifestyle  . Physical activity:    Days per week: Not on file    Minutes per session: Not on file  . Stress: Not on file  Relationships  . Social connections:    Talks on phone: Not on file    Gets  together: Not on file    Attends religious service: Not on file    Active member of club or organization: Not on file    Attends meetings of clubs or organizations: Not on file    Relationship status: Not on file  Other Topics Concern  . Not on file  Social History Narrative  . Not on file   History reviewed. No pertinent family history. No Known Allergies Prior to Admission medications   Medication Sig Start Date End Date Taking? Authorizing Provider  acetaminophen (TYLENOL) 500 MG tablet Take 500 mg by mouth every 6 (six) hours as needed.   Yes [provider]  colchicine 0.6 MG tablet Take 1 tablet (0.6 mg total) by mouth daily for 5 days. 12/28/17 02/11/18 Yes Fawze, Mina A, PA-C  allopurinol (ZYLOPRIM) 300 MG tablet Take 1 tablet (300 mg total) by mouth daily. 09/18/17   Loletta Specter, PA-C  doxycycline (VIBRAMYCIN) 50 MG capsule Take 1 capsule (50 mg total) by mouth 2 (two) times daily. 01/26/18   Teryl Lucy, MD  HYDROcodone-acetaminophen (NORCO) 5-325 MG tablet Take 1-2 tablets by mouth every 6 (six) hours as needed for moderate pain. MAXIMUM TOTAL ACETAMINOPHEN DOSE IS 4000 MG PER DAY 01/26/18   Teryl Lucy, MD  ibuprofen (ADVIL,MOTRIN) 600 MG tablet Take 1 tablet (  600 mg total) by mouth every 6 (six) hours as needed. 01/23/18   Fayrene Helperran, [place], PA-C  ondansetron (ZOFRAN ODT) 4 MG disintegrating tablet Take 1 tablet (4 mg total) by mouth every 8 (eight) hours as needed for nausea or vomiting. 11/16/17   Street, Grace[place]Mercedes, PA-C     Positive ROS: All other systems have been reviewed and were otherwise negative with the exception of those mentioned in the HPI and as above.  Physical Exam: General: Alert, no acute distress Cardiovascular: No pedal edema Respiratory: No cyanosis, no use of accessory musculature GI: No organomegaly, abdomen is soft and non-tender Skin: He has erythema with soft tissue swelling directly over the dorsum of the IP joint of the thumb.  A small skin  opening is still present at the felon, but no evidence for persistent purulence in that location. Neurologic: Sensation intact distally to the best that I can appreciate. Psychiatric: Patient is competent for consent with normal mood and affect Lymphatic: No axillary or cervical lymphadenopathy  MUSCULOSKELETAL: He has very limited thumb IP motion, he really will not move it much at all.  He has a shiny glossy appearance to the dorsum of the skin as well as overlying the joint.  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   Plan: Plan for Procedure(s): IRRIGATION AND DEBRIDEMENT OF LEFT THUMB, including the bone, possible cement interposition spacer  This is a severe complicated issue, I am asking the assistance of 1 of my hand surgery subspecialty colleagues, Dr. Dairl PonderMatthew Weingold.  There is risk that the infection could be persistent, require multiple future surgeries, even the potential for amputation.  I discussed this with the patient.  I have also gotten infectious disease consultation with Dr. Jerolyn Centerynthia Snyder, we have already placed a PICC line, we will begin IV vancomycin, with his first dose after surgical cultures are taken today.  The risks benefits and alternatives of been discussed at length including but not limited to the risks of infection, bleeding, nerve injury, persistent osteomyelitis, cardiopulmonary complications, among others.  Eulas PostJoshua P Bobak Oguinn, MD Cell [phone]   02/12/2018 12:12 PM

## 2018-02-12 NOTE — Anesthesia Postprocedure Evaluation (Signed)
Anesthesia Post Note  Patient: Brian Erickson  Procedure(s) Performed: IRRIGATION AND DEBRIDEMENT OF LEFT THUMB (Left Hand)     Patient location during evaluation: PACU Anesthesia Type: General Level of consciousness: awake Pain management: pain level controlled Vital Signs Assessment: post-procedure vital signs reviewed and stable Respiratory status: spontaneous breathing Cardiovascular status: stable Anesthetic complications: no    Last Vitals:  Vitals:   02/12/18 1400 02/12/18 1415  BP: 130/82 (!) 142/90  Pulse: 68 73  Resp: (!) 8 16  Temp:    SpO2: 98% 100%    Last Pain:  Vitals:   02/12/18 1415  TempSrc:   PainSc: 0-No pain                 Wynetta Seith

## 2018-02-12 NOTE — Op Note (Signed)
02/12/2018  1:34 PM  PATIENT:  Brian Erickson    PRE-OPERATIVE DIAGNOSIS:   1.  Left thumb felon infection 2.  Left thumb distal phalanx osteomyelitis 3.  Left thumb IP joint sepsis  POST-OPERATIVE DIAGNOSIS:  Same  PROCEDURE:    1.  Left thumb felon infection incision, irrigation, debridement 2.  Left thumb incision, irrigation, debridement, skin, subcutaneous tissue, bone, osteomyelitis of the distal phalanx 3.  Open incision, irrigation, debridement, left thumb IP joint  SURGEON:  Eulas PostJoshua P Noam Karaffa, MD  Co-surgeon: Dairl PonderMatthew Weingold, MD, present and scrubbed throughout the critical portions of the case, assisting with complex medical decision making  PHYSICIAN ASSISTANT: Janace LittenBrandon Parry, OPA-C, present and scrubbed throughout the case  ANESTHESIA:   General  PREOPERATIVE INDICATIONS:  Brian Erickson is a  52 y.o. male who had a left thumb felon infection, with a pre-existing history of fracture, who had undergone surgical incision, irrigation and debridement performed June 2.  In the office he had improved pain, but persistent dorsal swelling, and I was concerned about the potential for persistent infection.  X-ray taken yesterday demonstrated evidence for osteomyelitis.  This was dramatically worse than the x-ray taken June 2.  He elected for urgent revision surgical intervention in order to try and salvage his finger.  The risks benefits and alternatives were discussed with the patient preoperatively including but not limited to the risks of infection, bleeding, nerve injury, cardiopulmonary complications, the need for revision surgery, the potential for amputation, persistent osteomyelitis, among others, and the patient was willing to proceed.  ESTIMATED BLOOD LOSS: 15 mL  OPERATIVE IMPLANTS: Iodoform gauze in the IP joint and the palmar aspect of the thumb distal  OPERATIVE FINDINGS: There was significant purulence still present at the felon, and tracked down into a somewhat  necrotic distal phalanx with fragmentation.  The IP joint also had purulence extending, the aspect of the proximal phalanx at the articular surface seemed like it was still in good condition, but distally the bone quality was quite poor.  OPERATIVE PROCEDURE: The patient was brought to the operating room and placed in the supine position.  General anesthesia was administered.  IV vancomycin was held until after cultures were taken.  The left upper extremity was prepped and draped in the usual sterile fashion.  Timeout performed.  Half percent Marcaine was injected for a digital block both palmarly and dorsally for a ring block.  Incision was made through the previous incision at the site of his felon infection.  This was exposed, necrotic soft tissue removed, and a small amount of the bone also removed although we tried to preserve as much bone as possible in order to hopefully achieve reconstitution and maintenance of the distal tip length.  I did send some of the bone as well as soft tissue for Gram stain culture and sensitivity.  A small dorsal ulnar incision was made over the IP joint of the thumb, subcutaneous tissue was in good condition, but once I entered the joint I found significant purulence.  This was debrided and separate cultures were taken.  I used an 18-gauge needle to aspirate from the contralateral side of the joint, I did not get any more purulence or fluid, I think the entire thing was decompressed from the one side.  Both wounds were irrigated copiously, and then packed open with iodoform gauze, sterile dressing applied.  The vancomycin was initiated, the patient was awakened and returned to the PACU in stable and satisfactory condition.  There were no complications and he tolerated the procedure well.

## 2018-02-12 NOTE — Anesthesia Procedure Notes (Signed)
Procedure Name: LMA Insertion Date/Time: 02/12/2018 12:26 PM Performed by: [place] DesanctisLinka, Phillipa Morden L, CRNA Pre-anesthesia Checklist: Patient identified, Emergency Drugs available, Suction available, Patient being monitored and Timeout performed Patient Re-evaluated:Patient Re-evaluated prior to induction Oxygen Delivery Method: Circle system utilized Preoxygenation: Pre-oxygenation with 100% oxygen Induction Type: IV induction Ventilation: Mask ventilation without difficulty LMA: LMA inserted LMA Size: 5.0 Number of attempts: 1 Airway Equipment and Method: Bite block Placement Confirmation: positive ETCO2 Tube secured with: Tape Dental Injury: Teeth and Oropharynx as per pre-operative assessment

## 2018-02-12 NOTE — Anesthesia Preprocedure Evaluation (Addendum)
Anesthesia Evaluation  Patient identified by MRN, date of birth, ID band Patient awake    Reviewed: Allergy & Precautions, NPO status , Patient's Chart, lab work & pertinent test results  Airway Mallampati: II  TM Distance: >3 FB Neck ROM: Full    Dental  (+) Dental Advisory Given, Poor Dentition, Missing   Pulmonary neg pulmonary ROS,    Pulmonary exam normal breath sounds clear to auscultation       Cardiovascular negative cardio ROS Normal cardiovascular exam Rhythm:Regular Rate:Normal     Neuro/Psych negative neurological ROS     GI/Hepatic Neg liver ROS, GERD  ,  Endo/Other  Gout  Renal/GU negative Renal ROS     Musculoskeletal Left thumb infection Gout   Abdominal   Peds  Hematology negative hematology ROS (+)   Anesthesia Other Findings   Reproductive/Obstetrics                            Anesthesia Physical  Anesthesia Plan  ASA: II  Anesthesia Plan: General   Post-op Pain Management:    Induction: Intravenous  PONV Risk Score and Plan: 2 and Ondansetron, Dexamethasone, Treatment may vary due to age or medical condition and Midazolam  Airway Management Planned: LMA  Additional Equipment:   Intra-op Plan:   Post-operative Plan: Extubation in OR  Informed Consent: I have reviewed the patients History and Physical, chart, labs and discussed the procedure including the risks, benefits and alternatives for the proposed anesthesia with the patient or authorized representative who has indicated his/her understanding and acceptance.   Dental advisory given  Plan Discussed with: CRNA  Anesthesia Plan Comments:         Anesthesia Quick Evaluation

## 2018-02-12 NOTE — Transfer of Care (Signed)
Immediate Anesthesia Transfer of Care Note  Patient: Brian Erickson  Procedure(s) Performed: IRRIGATION AND DEBRIDEMENT OF LEFT THUMB (Left Hand)  Patient Location: PACU  Anesthesia Type:General  Level of Consciousness: sedated  Airway & Oxygen Therapy: Patient Spontanous Breathing and Patient connected to face mask oxygen  Post-op Assessment: Report given to RN and Post -op Vital signs reviewed and stable  Post vital signs: Reviewed and stable  Last Vitals:  Vitals Value Taken Time  BP 134/78 02/12/2018  1:16 PM  Temp    Pulse 64 02/12/2018  1:17 PM  Resp 10 02/12/2018  1:17 PM  SpO2 100 % 02/12/2018  1:17 PM  Vitals shown include unvalidated device data.  Last Pain:  Vitals:   02/12/18 1011  TempSrc: Oral  PainSc: 7       Patients Stated Pain Goal: 4 (02/12/18 1011)  Complications: No apparent anesthesia complications

## 2018-02-12 NOTE — Procedures (Signed)
Successful placement of single lumen PICC line to right basilic vein. Length 37 cm Tip at lower SVC/RA No complications Ready for use.   Brayton ElBRUNING, Samari Bittinger PA-C 02/12/2018 9:32 AM

## 2018-02-13 ENCOUNTER — Encounter (HOSPITAL_COMMUNITY)
Admission: RE | Admit: 2018-02-13 | Discharge: 2018-02-13 | Disposition: A | Discharge status: Home / Self Care | Payer: Self-pay | Source: Ambulatory Visit | Attending: Internal Medicine | Admitting: Internal Medicine

## 2018-02-13 ENCOUNTER — Encounter: Payer: Self-pay | Admitting: Internal Medicine

## 2018-02-13 ENCOUNTER — Ambulatory Visit (INDEPENDENT_AMBULATORY_CARE_PROVIDER_SITE_OTHER): Payer: Self-pay | Admitting: Internal Medicine

## 2018-02-13 VITALS — BP 157/83 | HR 91 | Temp 98.6°F | Wt 174.0 lb

## 2018-02-13 DIAGNOSIS — M868X4 Other osteomyelitis, hand: Secondary | ICD-10-CM | POA: Insufficient documentation

## 2018-02-13 DIAGNOSIS — M869 Osteomyelitis, unspecified: Secondary | ICD-10-CM

## 2018-02-13 LAB — BASIC METABOLIC PANEL
ANION GAP: 8 (ref 5–15)
BUN: 11 mg/dL (ref 6–20)
CALCIUM: 9.1 mg/dL (ref 8.9–10.3)
CO2: 23 mmol/L (ref 22–32)
CREATININE: 1.23 mg/dL (ref 0.61–1.24)
Chloride: 110 mmol/L (ref 101–111)
GFR calc non Af Amer: >60 mL/min (ref >60)
GLUCOSE: 162 mg/dL — AB (ref 65–99)
Potassium: 4 mmol/L (ref 3.5–5.1)
Sodium: 141 mmol/L (ref 135–145)

## 2018-02-13 LAB — CBC WITH DIFFERENTIAL/PLATELET
Abs Immature Granulocytes: 0 10*3/uL (ref 0.0–0.1)
BASOS PCT: 0 %
Basophils Absolute: 0 10*3/uL (ref 0.0–0.1)
EOS ABS: 0.1 10*3/uL (ref 0.0–0.7)
EOS PCT: 1 %
HEMATOCRIT: 44.5 % (ref 39.0–52.0)
Hemoglobin: 14.2 g/dL (ref 13.0–17.0)
IMMATURE GRANULOCYTES: 0 %
LYMPHS ABS: 2.2 10*3/uL (ref 0.7–4.0)
Lymphocytes Relative: 28 %
MCH: 28.6 pg (ref 26.0–34.0)
MCHC: 31.9 g/dL (ref 30.0–36.0)
MCV: 89.5 fL (ref 78.0–100.0)
MONOS PCT: 5 %
Monocytes Absolute: 0.4 10*3/uL (ref 0.1–1.0)
NEUTROS PCT: 66 %
Neutro Abs: 5 10*3/uL (ref 1.7–7.7)
Platelets: 320 10*3/uL (ref 150–400)
RBC: 4.97 MIL/uL (ref 4.22–5.81)
RDW: 12.8 % (ref 11.5–15.5)
WBC: 7.6 10*3/uL (ref 4.0–10.5)

## 2018-02-13 LAB — SEDIMENTATION RATE: Sed Rate: 12 mm/hr (ref 0–16)

## 2018-02-13 LAB — CK: Total CK: 291 U/L (ref 49–397)

## 2018-02-13 LAB — C-REACTIVE PROTEIN: CRP: 1 mg/dL — ABNORMAL HIGH (ref <1.0)

## 2018-02-13 MED ORDER — HEPARIN SOD (PORK) LOCK FLUSH 100 UNIT/ML IV SOLN: 250.0000 [IU] | INTRAVENOUS | Status: DC | PRN

## 2018-02-13 MED ORDER — HEPARIN SOD (PORK) LOCK FLUSH 100 UNIT/ML IV SOLN
250.0000 [IU] | Freq: Every day | INTRAVENOUS | Status: DC
  Administered 2018-02-13: 250 [IU]

## 2018-02-13 MED ORDER — SODIUM CHLORIDE 0.9 % IV SOLN
700.0000 mg | INTRAVENOUS | Status: DC
  Administered 2018-02-13: 700 mg via INTRAVENOUS
  Filled 2018-02-13: qty 14

## 2018-02-13 MED ORDER — HEPARIN SOD (PORK) LOCK FLUSH 100 UNIT/ML IV SOLN
INTRAVENOUS | Status: AC
  Filled 2018-02-13: qty 5

## 2018-02-13 NOTE — Progress Notes (Signed)
[facility]      Reason for Consult: finger osteomyelitis    Referring Physician: Dr. Dion SaucierLandau    Patient ID: Brian Erickson, male    DOB: 05/28/1966, 52 y.o.   MRN: [id number]  HPI:   He is here for evaluation of finger osteomyelitis.  He broke his left thumb about 1 year ago and recently developed more pain and swelling of the thumb.  He underwent I and D of the thumb but did not resolve infection.  He was then seen by Dr. Dion SaucierLandau and went to the OR yesterday and debrided, noted infected appearing bone.  PICC placed and here for evaluation of antibiotic treatment.  His cultures did grow MRSA and he received a dose of vancomycin and we were able to arrange daily daptomycin 6mg /kg via Short Stay.  His first dose is today.  He already feels like his thumb is better.    Past Medical History:  Diagnosis Date  . Felon of finger of left hand 01/26/2018  . 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 GERD (gastroesophageal reflux disease)   . Gout   . Infection of thumb    left +MRSA  . MRSA (methicillin resistant staph aureus) culture positive    current infection left thumb    Prior to Admission medications   Medication Sig Start Date End Date Taking? Authorizing Provider  acetaminophen (TYLENOL) 500 MG tablet Take 500 mg by mouth every 6 (six) hours as needed.   Yes [provider]  allopurinol (ZYLOPRIM) 300 MG tablet Take 1 tablet (300 mg total) by mouth daily. 09/18/17  Yes Loletta SpecterGomez, Roger David, PA-C  HYDROcodone-acetaminophen [facility](NORCO) 5-325 MG tablet Take 1-2 tablets by mouth every 6 (six) hours as needed for moderate pain. MAXIMUM TOTAL ACETAMINOPHEN DOSE IS 4000 MG PER DAY 02/12/18  Yes Teryl LucyLandau, Joshua, MD  ibuprofen (ADVIL,MOTRIN) 600 MG tablet Take 1 tablet (600 mg total) by mouth every 6 (six) hours as needed. 01/23/18  Yes Fayrene Helperran, [place], PA-C  colchicine 0.6 MG tablet Take 1 tablet (0.6 mg total) by mouth daily for 5 days. 12/28/17 02/11/18  Michela PitcherFawze, Mina  A, PA-C  ondansetron (ZOFRAN ODT) 4 MG disintegrating tablet Take 1 tablet (4 mg total) by mouth every 8 (eight) hours as needed for nausea or vomiting. Patient not taking: Reported on 02/13/2018 11/16/17   Street, [place], PA-C    No Known Allergies  Social History   Tobacco Use  . Smoking status: Never Smoker  . Smokeless tobacco: Never Used  Substance Use Topics  . Alcohol use: No    Comment: socially   . Drug use: No    FMH: + cardiac disease  Review of Systems  Constitutional: negative for fevers, chills and fatigue Gastrointestinal: negative for diarrhea Integument/breast: negative for rash and he did develop a small local rash on his left arm after the nerve block during one of the ED visits Musculoskeletal: negative for myalgias and arthralgias All other systems reviewed and are negative    Constitutional: in no apparent distress  Vitals:   02/13/18 0906  BP: (!) 157/83  Pulse: 91  Temp: 98.6 F (37 C)   EYES: anicteric ENMT: no thrush Cardiovascular: Cor RRR Respiratory: CTA B; normal respiratory effort GI: Bowel sounds are normal, liver is not enlarged, spleen is not enlarged Musculoskeletal: no pedal edema noted Skin: negatives: no rash Hematologic: no cervical lad  Labs: Lab Results  Component Value Date   WBC 10.0 01/26/2018  HGB 15.3 01/26/2018   HCT 47.3 01/26/2018   MCV 88.9 01/26/2018   PLT 358 01/26/2018    Lab Results  Component Value Date   CREATININE 1.07 01/26/2018   BUN 11 01/26/2018   NA 139 01/26/2018   K 4.2 01/26/2018   CL 107 01/26/2018   CO2 23 01/26/2018    Lab Results  Component Value Date   ALT 14 (L) 11/16/2017   AST 21 11/16/2017   ALKPHOS 67 11/16/2017   BILITOT 0.8 11/16/2017     Assessment: thumb osteomyelitis.  I discussed treatment with daptomycin at 6mg /kg and need for 6 weeks.  He will start today.  Discussed side effects including myalgias, rash, diarrhea.    Plan: 1) daptomycin through July 31 2) rtc  3 weeks 3) weekly labs 4) pull picc line at the end of treatment

## 2018-02-14 ENCOUNTER — Encounter (HOSPITAL_COMMUNITY)
Admission: RE | Admit: 2018-02-14 | Discharge: 2018-02-14 | Disposition: A | Discharge status: Home / Self Care | Payer: Self-pay | Source: Ambulatory Visit | Attending: Internal Medicine | Admitting: Internal Medicine

## 2018-02-14 MED ORDER — SODIUM CHLORIDE 0.9 % IV SOLN
700.0000 mg | INTRAVENOUS | Status: DC
  Administered 2018-02-14: 700 mg via INTRAVENOUS
  Filled 2018-02-14: qty 14

## 2018-02-14 MED ORDER — HEPARIN SOD (PORK) LOCK FLUSH 100 UNIT/ML IV SOLN
250.0000 [IU] | INTRAVENOUS | Status: DC | PRN
  Administered 2018-02-14: 250 [IU]

## 2018-02-14 MED ORDER — HEPARIN SOD (PORK) LOCK FLUSH 100 UNIT/ML IV SOLN
INTRAVENOUS | Status: AC
  Administered 2018-02-14: 250 [IU]
  Filled 2018-02-14: qty 5

## 2018-02-14 MED ORDER — HEPARIN SOD (PORK) LOCK FLUSH 100 UNIT/ML IV SOLN: 250.0000 [IU] | Freq: Every day | INTRAVENOUS | Status: DC

## 2018-02-15 ENCOUNTER — Ambulatory Visit (HOSPITAL_COMMUNITY)
Admission: RE | Admit: 2018-02-15 | Discharge: 2018-02-15 | Disposition: A | Discharge status: Home / Self Care | Payer: Medicaid Other | Source: Ambulatory Visit | Attending: Internal Medicine | Admitting: Internal Medicine

## 2018-02-15 DIAGNOSIS — M868X4 Other osteomyelitis, hand: Secondary | ICD-10-CM | POA: Insufficient documentation

## 2018-02-15 MED ORDER — SODIUM CHLORIDE 0.9 % IV SOLN
700.0000 mg | INTRAVENOUS | Status: DC
  Administered 2018-02-15: 700 mg via INTRAVENOUS
  Filled 2018-02-15: qty 14

## 2018-02-15 MED ORDER — DIPHENHYDRAMINE HCL 50 MG/ML IJ SOLN
INTRAMUSCULAR | Status: AC
  Filled 2018-02-15: qty 1

## 2018-02-15 MED ORDER — HEPARIN SOD (PORK) LOCK FLUSH 100 UNIT/ML IV SOLN: 250.0000 [IU] | INTRAVENOUS | Status: DC | PRN

## 2018-02-15 MED ORDER — HEPARIN SOD (PORK) LOCK FLUSH 100 UNIT/ML IV SOLN: 250.0000 [IU] | Freq: Every day | INTRAVENOUS | Status: DC

## 2018-02-15 MED ORDER — DIPHENHYDRAMINE HCL 50 MG/ML IJ SOLN
25.0000 mg | Freq: Once | INTRAMUSCULAR | Status: AC
  Administered 2018-02-15: 25 mg via INTRAVENOUS

## 2018-02-15 NOTE — Progress Notes (Signed)
Pt arrived c/o rash and itching BUE. Some areas noted to be bleeding from scratching skin.  Denies any other area of involvement.  Dr Luciana Axeomer notified.  New order to administer Benadryl prior to infusion of Daptomycin.  Also states to call tomorow prior to infusion if rash worsens.  If rash and itching remain the same, continue with infusion.  Also states ok for pt to take Benadryl at home before coming to hospital. Benadryl administered per order.  Pt states he has a ride home and will not be driving himself.  VSS.

## 2018-02-15 NOTE — Progress Notes (Signed)
Pt tolerated Daptomycin infusion without any c/o itching or increase in rash. VSS.  Verbalizes understanding of instructions.  Called family for ride home to meet at ED entrance.  Pt slept during infusion, ambulated to ED with steady gait. Pt to call in am if any increase in symptoms overnight.  No AND.

## 2018-02-16 ENCOUNTER — Encounter (HOSPITAL_COMMUNITY)
Admission: RE | Admit: 2018-02-16 | Discharge: 2018-02-16 | Disposition: A | Discharge status: Home / Self Care | Payer: Self-pay | Source: Ambulatory Visit | Attending: Internal Medicine | Admitting: Internal Medicine

## 2018-02-16 MED ORDER — HEPARIN SOD (PORK) LOCK FLUSH 100 UNIT/ML IV SOLN: 250.0000 [IU] | Freq: Every day | INTRAVENOUS | Status: DC

## 2018-02-16 MED ORDER — HEPARIN SOD (PORK) LOCK FLUSH 100 UNIT/ML IV SOLN: 250.0000 [IU] | INTRAVENOUS | Status: DC | PRN

## 2018-02-16 MED ORDER — DAPTOMYCIN 500 MG IV SOLR
700.0000 mg | INTRAVENOUS | Status: DC
  Administered 2018-02-16: 700 mg via INTRAVENOUS
  Filled 2018-02-16: qty 14

## 2018-02-17 ENCOUNTER — Encounter (HOSPITAL_COMMUNITY)
Admission: RE | Admit: 2018-02-17 | Discharge: 2018-02-17 | Disposition: A | Discharge status: Home / Self Care | Payer: Self-pay | Source: Ambulatory Visit | Attending: Internal Medicine | Admitting: Internal Medicine

## 2018-02-17 LAB — AEROBIC/ANAEROBIC CULTURE W GRAM STAIN (SURGICAL/DEEP WOUND)

## 2018-02-17 LAB — AEROBIC/ANAEROBIC CULTURE (SURGICAL/DEEP WOUND)

## 2018-02-17 MED ORDER — SODIUM CHLORIDE 0.9 % IV SOLN
700.0000 mg | INTRAVENOUS | Status: DC
  Administered 2018-02-17: 700 mg via INTRAVENOUS
  Filled 2018-02-17: qty 14

## 2018-02-17 MED ORDER — DIPHENHYDRAMINE HCL 50 MG/ML IJ SOLN
INTRAMUSCULAR | Status: AC
  Administered 2018-02-17: 25 mg
  Filled 2018-02-17: qty 1

## 2018-02-17 MED ORDER — HEPARIN SOD (PORK) LOCK FLUSH 100 UNIT/ML IV SOLN
250.0000 [IU] | INTRAVENOUS | Status: DC | PRN
  Administered 2018-02-17: 250 [IU]

## 2018-02-17 MED ORDER — HEPARIN SOD (PORK) LOCK FLUSH 100 UNIT/ML IV SOLN
INTRAVENOUS | Status: AC
  Administered 2018-02-17: 250 [IU]
  Filled 2018-02-17: qty 5

## 2018-02-17 MED ORDER — DIPHENHYDRAMINE HCL 50 MG/ML IJ SOLN: 25.0000 mg | Freq: Once | INTRAMUSCULAR | Status: DC

## 2018-02-18 ENCOUNTER — Encounter (HOSPITAL_COMMUNITY)
Admission: RE | Admit: 2018-02-18 | Discharge: 2018-02-18 | Disposition: A | Discharge status: Home / Self Care | Payer: Self-pay | Source: Ambulatory Visit | Attending: Internal Medicine | Admitting: Internal Medicine

## 2018-02-18 MED ORDER — DIPHENHYDRAMINE HCL 50 MG/ML IJ SOLN
25.0000 mg | Freq: Once | INTRAMUSCULAR | Status: DC
  Administered 2018-02-18: 25 mg via INTRAVENOUS

## 2018-02-18 MED ORDER — SODIUM CHLORIDE 0.9 % IV SOLN
700.0000 mg | INTRAVENOUS | Status: DC
  Administered 2018-02-18: 700 mg via INTRAVENOUS
  Filled 2018-02-18: qty 14

## 2018-02-18 MED ORDER — HEPARIN SOD (PORK) LOCK FLUSH 100 UNIT/ML IV SOLN: 250.0000 [IU] | INTRAVENOUS | Status: DC | PRN

## 2018-02-18 MED ORDER — DIPHENHYDRAMINE HCL 50 MG/ML IJ SOLN
INTRAMUSCULAR | Status: AC
  Administered 2018-02-18: 25 mg via INTRAVENOUS
  Filled 2018-02-18: qty 1

## 2018-02-18 MED ORDER — HEPARIN SOD (PORK) LOCK FLUSH 100 UNIT/ML IV SOLN
INTRAVENOUS | Status: AC
  Administered 2018-02-18: 250 [IU]
  Filled 2018-02-18: qty 5

## 2018-02-19 ENCOUNTER — Ambulatory Visit (HOSPITAL_COMMUNITY)
Admission: RE | Admit: 2018-02-19 | Discharge: 2018-02-19 | Disposition: A | Discharge status: Home / Self Care | Payer: Medicaid Other | Source: Ambulatory Visit | Attending: Internal Medicine | Admitting: Internal Medicine

## 2018-02-19 ENCOUNTER — Telehealth: Payer: Self-pay | Admitting: *Deleted

## 2018-02-19 DIAGNOSIS — M869 Osteomyelitis, unspecified: Secondary | ICD-10-CM | POA: Insufficient documentation

## 2018-02-19 MED ORDER — HEPARIN SOD (PORK) LOCK FLUSH 100 UNIT/ML IV SOLN: 250.0000 [IU] | INTRAVENOUS | Status: DC | PRN

## 2018-02-19 MED ORDER — DIPHENHYDRAMINE HCL 50 MG/ML IJ SOLN: 25.0000 mg | Freq: Once | INTRAMUSCULAR | Status: DC

## 2018-02-19 MED ORDER — HEPARIN SOD (PORK) LOCK FLUSH 100 UNIT/ML IV SOLN
INTRAVENOUS | Status: AC
  Administered 2018-02-19: 250 [IU]
  Filled 2018-02-19: qty 5

## 2018-02-19 MED ORDER — DIPHENHYDRAMINE HCL 50 MG/ML IJ SOLN
INTRAMUSCULAR | Status: AC
  Administered 2018-02-19: 25 mg
  Filled 2018-02-19: qty 1

## 2018-02-19 MED ORDER — SODIUM CHLORIDE 0.9 % IV SOLN
700.0000 mg | INTRAVENOUS | Status: DC
  Administered 2018-02-19: 700 mg via INTRAVENOUS
  Filled 2018-02-19: qty 14

## 2018-02-19 NOTE — Telephone Encounter (Signed)
Patient will come 6/27 after infusion. He is on the nurse schedule, although Judeth CornfieldStephanie may be available to help if he is here at 8:30/8:45. Andree CossHowell, Ysabella Babiarz M, RN

## 2018-02-19 NOTE — Telephone Encounter (Signed)
-----   Message from Gardiner Barefootobert W Comer, MD sent at 02/17/2018  1:51 PM EDT ----- Can you get him in to see me tomorrow am for a ? Drug rash while he is on daptomycin so I can see if he needs to change?  Or with NP.  thanks

## 2018-02-20 ENCOUNTER — Ambulatory Visit: Payer: Medicaid Other

## 2018-02-20 ENCOUNTER — Ambulatory Visit (INDEPENDENT_AMBULATORY_CARE_PROVIDER_SITE_OTHER): Payer: Medicaid Other | Admitting: Infectious Diseases

## 2018-02-20 ENCOUNTER — Encounter (HOSPITAL_COMMUNITY)
Admission: RE | Admit: 2018-02-20 | Discharge: 2018-02-20 | Disposition: A | Discharge status: Home / Self Care | Payer: Self-pay | Source: Ambulatory Visit | Attending: Internal Medicine | Admitting: Internal Medicine

## 2018-02-20 DIAGNOSIS — R21 Rash and other nonspecific skin eruption: Secondary | ICD-10-CM | POA: Insufficient documentation

## 2018-02-20 DIAGNOSIS — Z5181 Encounter for therapeutic drug level monitoring: Secondary | ICD-10-CM

## 2018-02-20 LAB — CBC WITH DIFFERENTIAL/PLATELET
ABS IMMATURE GRANULOCYTES: 0 10*3/uL (ref 0.0–0.1)
Basophils Absolute: 0.1 10*3/uL (ref 0.0–0.1)
Basophils Relative: 1 %
EOS PCT: 3 %
Eosinophils Absolute: 0.2 10*3/uL (ref 0.0–0.7)
HEMATOCRIT: 48.7 % (ref 39.0–52.0)
HEMOGLOBIN: 15.4 g/dL (ref 13.0–17.0)
Immature Granulocytes: 1 %
LYMPHS ABS: 2.2 10*3/uL (ref 0.7–4.0)
LYMPHS PCT: 40 %
MCH: 28.6 pg (ref 26.0–34.0)
MCHC: 31.6 g/dL (ref 30.0–36.0)
MCV: 90.5 fL (ref 78.0–100.0)
MONO ABS: 0.3 10*3/uL (ref 0.1–1.0)
Monocytes Relative: 6 %
Neutro Abs: 2.7 10*3/uL (ref 1.7–7.7)
Neutrophils Relative %: 49 %
Platelets: 249 10*3/uL (ref 150–400)
RBC: 5.38 MIL/uL (ref 4.22–5.81)
RDW: 13.1 % (ref 11.5–15.5)
WBC: 5.5 10*3/uL (ref 4.0–10.5)

## 2018-02-20 LAB — CK: Total CK: 805 U/L — ABNORMAL HIGH (ref 49–397)

## 2018-02-20 LAB — BASIC METABOLIC PANEL
Anion gap: 12 (ref 5–15)
BUN: 13 mg/dL (ref 6–20)
CALCIUM: 9.6 mg/dL (ref 8.9–10.3)
CO2: 24 mmol/L (ref 22–32)
CREATININE: 1.45 mg/dL — AB (ref 0.61–1.24)
Chloride: 105 mmol/L (ref 98–111)
GFR calc Af Amer: >60 mL/min (ref >60)
GFR calc non Af Amer: 53 mL/min — ABNORMAL LOW (ref >60)
GLUCOSE: 174 mg/dL — AB (ref 70–99)
Potassium: 3.6 mmol/L (ref 3.5–5.1)
SODIUM: 141 mmol/L (ref 135–145)

## 2018-02-20 MED ORDER — DIPHENHYDRAMINE HCL 50 MG/ML IJ SOLN
25.0000 mg | Freq: Once | INTRAMUSCULAR | Status: DC
  Administered 2018-02-20: 25 mg via INTRAVENOUS

## 2018-02-20 MED ORDER — SODIUM CHLORIDE 0.9 % IV SOLN
700.0000 mg | INTRAVENOUS | Status: DC
  Administered 2018-02-20: 700 mg via INTRAVENOUS
  Filled 2018-02-20: qty 14

## 2018-02-20 MED ORDER — DIPHENHYDRAMINE HCL 50 MG/ML IJ SOLN
INTRAMUSCULAR | Status: AC
  Administered 2018-02-20: 25 mg via INTRAVENOUS
  Filled 2018-02-20: qty 1

## 2018-02-20 MED ORDER — HEPARIN SOD (PORK) LOCK FLUSH 100 UNIT/ML IV SOLN
INTRAVENOUS | Status: AC
  Administered 2018-02-20: 250 [IU]
  Filled 2018-02-20: qty 5

## 2018-02-20 MED ORDER — HEPARIN SOD (PORK) LOCK FLUSH 100 UNIT/ML IV SOLN: 250.0000 [IU] | INTRAVENOUS | Status: DC | PRN

## 2018-02-20 NOTE — Patient Instructions (Signed)
Regarding your rash - I do feel that it is safe to continue your antibiotic.   I would before your infusions take 50 mg (2 tablets) of benadryl 45-60 minutes before your infusion. Try to limit this to no more than 6 doses a day. Major side effect is sleepiness.   I would try over the counter hydrocortisone cream to the bumps that itch the most to see if this will also help without making you sleepier. Can put a thin layer on 2-3 times a day.   Will need to keep an eye on your blood work with the infusions - would recommend continuing your daptomycin for now.

## 2018-02-20 NOTE — Progress Notes (Signed)
Patient: Brian Erickson  DOB: [birth year] MRN: [id number] PCP: Loletta Specter, PA-C   Patient Active Problem List   Diagnosis Date Noted  . Medication monitoring encounter 02/20/2018  . Rash and nonspecific skin eruption 02/20/2018  . 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 Felon of finger of left hand 01/26/2018  . Screening examination for STD (sexually transmitted disease) 12/24/2016     Subjective:   Chief Complaint  Patient presents with  . Rash    NICHOLAOS SCHIPPERS is a 52 y.o.  AA male here to evaluate a possible drug rash. He started on once daily daptomycin 6mg /kg/day dosing for left thumb osteomyelitis d/t MRSA. He receives these infusion at the [facility] at [facility] and is projected to continue treatment for 6 weeks through July 31st. He has received 7 doses so far. Since starting this medication he has noticed an itchy rash to his arms bilaterally. The areas of small dark circles that are raised. There is pruritis. No face/tongue or lip swelling noted. No breathing trouble. He may have a spot or two on his shins. He has been taking benadryl often after his infusions and went through a 24 count pack since Saturday. Denies fevers or chills. Thumb is sore but better and not draining.   Review of Systems  All other systems reviewed and are negative.  Past Medical History:  Diagnosis Date  . Felon of finger of left hand 01/26/2018  . 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 GERD (gastroesophageal reflux disease)   . Gout   . Infection of thumb    left +MRSA  . MRSA (methicillin resistant staph aureus) culture positive    current infection left thumb    Outpatient Medications Prior to Visit  Medication Sig Dispense Refill  . acetaminophen (TYLENOL) 500 MG tablet Take 500 mg by mouth every 6 (six) hours as needed.    Marland Kitchen allopurinol (ZYLOPRIM) 300 MG tablet Take 1 tablet (300 mg total) by mouth daily. 30 tablet 6  . colchicine 0.6 MG tablet Take 1  tablet (0.6 mg total) by mouth daily for 5 days. 5 tablet 0  . HYDROcodone-acetaminophen (NORCO) 5-325 MG tablet Take 1-2 tablets by mouth every 6 (six) hours as needed for moderate pain. MAXIMUM TOTAL ACETAMINOPHEN DOSE IS 4000 MG PER DAY 30 tablet 0  . ibuprofen (ADVIL,MOTRIN) 600 MG tablet Take 1 tablet (600 mg total) by mouth every 6 (six) hours as needed. 30 tablet 0  . ondansetron (ZOFRAN ODT) 4 MG disintegrating tablet Take 1 tablet (4 mg total) by mouth every 8 (eight) hours as needed for nausea or vomiting. (Patient not taking: Reported on 02/13/2018) 15 tablet 0   Facility-Administered Medications Prior to Visit  Medication Dose Route Frequency Provider Last Rate Last Dose  . DAPTOmycin (CUBICIN) 700 mg in sodium chloride 0.9 % IVPB  700 mg Intravenous Q24H Gardiner Barefoot, MD   Stopped at 02/20/18 [phone]  . heparin lock flush 100 unit/mL  250 Units Intracatheter Prior to discharge Comer, Belia Heman, MD         No Known Allergies  Social History   Tobacco Use  . Smoking status: Never Smoker  . Smokeless tobacco: Never Used  Substance Use Topics  . Alcohol use: No    Comment: socially   . Drug use: No    Objective:  There were no vitals filed for this visit. There is no height or weight on file to calculate BMI.  Physical Exam  Constitutional: He is oriented to person, place, and time.  Seated in chair. Appears fatigued and frustrated.   HENT:  Mouth/Throat: No oral lesions. Normal dentition. No dental caries. No oropharyngeal exudate.  Eyes: Pupils are equal, round, and reactive to light. No scleral icterus.  Cardiovascular: Normal rate, regular rhythm and normal heart sounds.  Pulmonary/Chest: Effort normal and breath sounds normal.  Abdominal: Soft. He exhibits no distension. There is no tenderness.  Lymphadenopathy:    He has no cervical adenopathy.    He has no axillary adenopathy.  Neurological: He is alert and oriented to person, place, and time.  Skin: Skin is  warm and dry. Rash noted.  Maculo-pustular rash over forearms. 2 spots to his upper back.   Vitals reviewed.      Lab Results: Lab Results  Component Value Date   WBC 5.5 02/20/2018   HGB 15.4 02/20/2018   HCT 48.7 02/20/2018   MCV 90.5 02/20/2018   PLT 249 02/20/2018    Lab Results  Component Value Date   CREATININE 1.45 (H) 02/20/2018   BUN 13 02/20/2018   NA 141 02/20/2018   K 3.6 02/20/2018   CL 105 02/20/2018   CO2 24 02/20/2018    Lab Results  Component Value Date   ALT 14 (L) 11/16/2017   AST 21 11/16/2017   ALKPHOS 67 11/16/2017   BILITOT 0.8 11/16/2017     Assessment & Plan:   Problem List Items Addressed This Visit      Musculoskeletal and Integument   Rash and nonspecific skin eruption    Maculo-pustular rash to bilateral forearms, few spots on his shins and 2 spots on his upper back between shoulder blades. There is no evidence of sloughing rash over any aspect of his body including fingers/palms or mucus membranes. It may that the rash is due to the daptomycin however I do not think it is something that would make it unsafe to continue therapy as long as he can tolerate this.  We discussed pre-medicating 45-60 minutes before infusion with 50 mg Benadryl. I offered hydroxyzine today but he cannot afford this.  Will also do topical hydrocortisone 2-3x daily to the lesions.  He will follow up with Dr. Luciana Axeomer as scheduled.         Other   Medication monitoring encounter    CK elevated after 1 week from 291 > 805. He has no myalgias or discoloration of urine. Discussed with Rogelia BogaMinh today and will continue to follow with labs again next week and monitor. I discussed this with him today.         Rexene AlbertsStephanie Kartik Fernando, MSN, NP-C [facility] Pager: [phone] Office: [phone]  02/21/18  3:20 PM

## 2018-02-20 NOTE — Assessment & Plan Note (Signed)
Maculo-pustular rash to bilateral forearms, few spots on his shins and 2 spots on his upper back between shoulder blades. There is no evidence of sloughing rash over any aspect of his body including fingers/palms or mucus membranes. It may that the rash is due to the daptomycin however I do not think it is something that would make it unsafe to continue therapy as long as he can tolerate this.  We discussed pre-medicating 45-60 minutes before infusion with 50 mg Benadryl. I offered hydroxyzine today but he cannot afford this.  Will also do topical hydrocortisone 2-3x daily to the lesions.  He will follow up with Dr. Luciana Axeomer as scheduled.

## 2018-02-20 NOTE — Assessment & Plan Note (Signed)
CK elevated after 1 week from 291 > 805. He has no myalgias or discoloration of urine. Discussed with Rogelia BogaMinh today and will continue to follow with labs again next week and monitor. I discussed this with him today.

## 2018-02-21 ENCOUNTER — Ambulatory Visit: Payer: Medicaid Other | Admitting: Infectious Diseases

## 2018-02-21 ENCOUNTER — Ambulatory Visit (HOSPITAL_COMMUNITY)
Admission: RE | Admit: 2018-02-21 | Discharge: 2018-02-21 | Disposition: A | Discharge status: Home / Self Care | Payer: Medicaid Other | Source: Ambulatory Visit | Attending: Internal Medicine | Admitting: Internal Medicine

## 2018-02-21 ENCOUNTER — Encounter: Payer: Self-pay | Admitting: Infectious Diseases

## 2018-02-21 DIAGNOSIS — M868X4 Other osteomyelitis, hand: Secondary | ICD-10-CM | POA: Insufficient documentation

## 2018-02-21 MED ORDER — DIPHENHYDRAMINE HCL 50 MG/ML IJ SOLN: 25.0000 mg | Freq: Once | INTRAMUSCULAR | Status: DC

## 2018-02-21 MED ORDER — HEPARIN SOD (PORK) LOCK FLUSH 100 UNIT/ML IV SOLN
INTRAVENOUS | Status: AC
Start: 2018-02-21 — End: 2018-02-21
  Administered 2018-02-21: 250 [IU]
  Filled 2018-02-21: qty 5

## 2018-02-21 MED ORDER — DIPHENHYDRAMINE HCL 50 MG/ML IJ SOLN
INTRAMUSCULAR | Status: AC
  Administered 2018-02-21: 25 mg
  Filled 2018-02-21: qty 1

## 2018-02-21 MED ORDER — HEPARIN SOD (PORK) LOCK FLUSH 100 UNIT/ML IV SOLN: 250.0000 [IU] | INTRAVENOUS | Status: DC | PRN

## 2018-02-21 MED ORDER — SODIUM CHLORIDE 0.9 % IV SOLN
700.0000 mg | INTRAVENOUS | Status: DC
  Administered 2018-02-21: 700 mg via INTRAVENOUS
  Filled 2018-02-21: qty 14

## 2018-02-22 ENCOUNTER — Encounter (HOSPITAL_COMMUNITY)
Admission: RE | Admit: 2018-02-22 | Discharge: 2018-02-22 | Disposition: A | Discharge status: Home / Self Care | Payer: Self-pay | Source: Ambulatory Visit | Attending: Internal Medicine | Admitting: Internal Medicine

## 2018-02-22 MED ORDER — SODIUM CHLORIDE 0.9 % IV SOLN
700.0000 mg | INTRAVENOUS | Status: DC
  Administered 2018-02-22: 700 mg via INTRAVENOUS
  Filled 2018-02-22: qty 14

## 2018-02-22 MED ORDER — DIPHENHYDRAMINE HCL 50 MG/ML IJ SOLN
25.0000 mg | Freq: Once | INTRAMUSCULAR | Status: AC
  Administered 2018-02-22: 25 mg via INTRAVENOUS

## 2018-02-22 MED ORDER — HEPARIN SOD (PORK) LOCK FLUSH 100 UNIT/ML IV SOLN: 250.0000 [IU] | INTRAVENOUS | Status: DC | PRN

## 2018-02-23 ENCOUNTER — Encounter (HOSPITAL_COMMUNITY)
Admission: RE | Admit: 2018-02-23 | Discharge: 2018-02-23 | Disposition: A | Discharge status: Home / Self Care | Payer: Self-pay | Source: Ambulatory Visit | Attending: Internal Medicine | Admitting: Internal Medicine

## 2018-02-23 MED ORDER — DIPHENHYDRAMINE HCL 50 MG/ML IJ SOLN
25.0000 mg | Freq: Once | INTRAMUSCULAR | Status: AC
  Administered 2018-02-23: 25 mg via INTRAVENOUS

## 2018-02-23 MED ORDER — HEPARIN SOD (PORK) LOCK FLUSH 100 UNIT/ML IV SOLN: 250.0000 [IU] | INTRAVENOUS | Status: DC | PRN

## 2018-02-23 MED ORDER — DIPHENHYDRAMINE HCL 50 MG/ML IJ SOLN
INTRAMUSCULAR | Status: AC
  Filled 2018-02-23: qty 1

## 2018-02-23 MED ORDER — SODIUM CHLORIDE 0.9 % IV SOLN
700.0000 mg | INTRAVENOUS | Status: DC
  Administered 2018-02-23: 700 mg via INTRAVENOUS
  Filled 2018-02-23: qty 14

## 2018-02-24 ENCOUNTER — Ambulatory Visit (HOSPITAL_COMMUNITY)
Admission: RE | Admit: 2018-02-24 | Discharge: 2018-02-24 | Disposition: A | Discharge status: Home / Self Care | Payer: Medicaid Other | Source: Ambulatory Visit | Attending: Internal Medicine | Admitting: Internal Medicine

## 2018-02-24 ENCOUNTER — Ambulatory Visit (INDEPENDENT_AMBULATORY_CARE_PROVIDER_SITE_OTHER): Payer: Medicaid Other | Admitting: Physician Assistant

## 2018-02-24 DIAGNOSIS — M869 Osteomyelitis, unspecified: Secondary | ICD-10-CM | POA: Insufficient documentation

## 2018-02-24 MED ORDER — DIPHENHYDRAMINE HCL 50 MG/ML IJ SOLN: 25.0000 mg | Freq: Once | INTRAMUSCULAR | Status: DC

## 2018-02-24 MED ORDER — DIPHENHYDRAMINE HCL 50 MG/ML IJ SOLN
INTRAMUSCULAR | Status: AC
  Administered 2018-02-24: 25 mg
  Filled 2018-02-24: qty 1

## 2018-02-24 MED ORDER — HEPARIN SOD (PORK) LOCK FLUSH 100 UNIT/ML IV SOLN
INTRAVENOUS | Status: AC
  Administered 2018-02-24: 250 [IU]
  Filled 2018-02-24: qty 5

## 2018-02-24 MED ORDER — HEPARIN SOD (PORK) LOCK FLUSH 100 UNIT/ML IV SOLN
250.0000 [IU] | INTRAVENOUS | Status: DC | PRN
  Administered 2018-02-24: 250 [IU]

## 2018-02-24 MED ORDER — SODIUM CHLORIDE 0.9 % IV SOLN
700.0000 mg | INTRAVENOUS | Status: DC
  Administered 2018-02-24: 700 mg via INTRAVENOUS
  Filled 2018-02-24: qty 14

## 2018-02-25 ENCOUNTER — Ambulatory Visit (HOSPITAL_COMMUNITY)
Admission: RE | Admit: 2018-02-25 | Discharge: 2018-02-25 | Disposition: A | Discharge status: Home / Self Care | Payer: Medicaid Other | Source: Ambulatory Visit | Attending: Internal Medicine | Admitting: Internal Medicine

## 2018-02-25 DIAGNOSIS — M869 Osteomyelitis, unspecified: Secondary | ICD-10-CM | POA: Insufficient documentation

## 2018-02-25 MED ORDER — DIPHENHYDRAMINE HCL 50 MG/ML IJ SOLN
25.0000 mg | Freq: Once | INTRAMUSCULAR | Status: DC
  Administered 2018-02-25: 25 mg via INTRAVENOUS

## 2018-02-25 MED ORDER — HEPARIN SOD (PORK) LOCK FLUSH 100 UNIT/ML IV SOLN
250.0000 [IU] | INTRAVENOUS | Status: DC | PRN
  Administered 2018-02-25: 250 [IU]

## 2018-02-25 MED ORDER — SODIUM CHLORIDE 0.9 % IV SOLN
700.0000 mg | INTRAVENOUS | Status: DC
  Administered 2018-02-25: 700 mg via INTRAVENOUS
  Filled 2018-02-25: qty 14

## 2018-02-25 MED ORDER — DIPHENHYDRAMINE HCL 50 MG/ML IJ SOLN
INTRAMUSCULAR | Status: AC
  Administered 2018-02-25: 25 mg via INTRAVENOUS
  Filled 2018-02-25: qty 1

## 2018-02-25 MED ORDER — HEPARIN SOD (PORK) LOCK FLUSH 100 UNIT/ML IV SOLN
INTRAVENOUS | Status: AC
  Administered 2018-02-25: 250 [IU]
  Filled 2018-02-25: qty 5

## 2018-02-26 ENCOUNTER — Ambulatory Visit: Payer: Self-pay | Admitting: Infectious Diseases

## 2018-02-26 ENCOUNTER — Encounter (HOSPITAL_COMMUNITY)
Admission: RE | Admit: 2018-02-26 | Discharge: 2018-02-26 | Disposition: A | Discharge status: Home / Self Care | Payer: Self-pay | Source: Ambulatory Visit | Attending: Internal Medicine | Admitting: Internal Medicine

## 2018-02-26 DIAGNOSIS — M868X4 Other osteomyelitis, hand: Secondary | ICD-10-CM | POA: Insufficient documentation

## 2018-02-26 MED ORDER — HEPARIN SOD (PORK) LOCK FLUSH 100 UNIT/ML IV SOLN
250.0000 [IU] | INTRAVENOUS | Status: DC | PRN
  Administered 2018-02-26: 250 [IU]

## 2018-02-26 MED ORDER — HEPARIN SOD (PORK) LOCK FLUSH 100 UNIT/ML IV SOLN
INTRAVENOUS | Status: AC
  Administered 2018-02-26: 250 [IU]
  Filled 2018-02-26: qty 5

## 2018-02-26 MED ORDER — SODIUM CHLORIDE 0.9 % IV SOLN
700.0000 mg | INTRAVENOUS | Status: DC
  Administered 2018-02-26: 700 mg via INTRAVENOUS
  Filled 2018-02-26: qty 14

## 2018-02-26 MED ORDER — DIPHENHYDRAMINE HCL 50 MG/ML IJ SOLN
25.0000 mg | Freq: Once | INTRAMUSCULAR | Status: AC
  Administered 2018-02-26: 25 mg via INTRAVENOUS

## 2018-02-26 MED ORDER — DIPHENHYDRAMINE HCL 50 MG/ML IJ SOLN
INTRAMUSCULAR | Status: AC
  Administered 2018-02-26: 25 mg via INTRAVENOUS
  Filled 2018-02-26: qty 1

## 2018-02-27 ENCOUNTER — Inpatient Hospital Stay (HOSPITAL_COMMUNITY): Admission: RE | Admit: 2018-02-27 | Payer: Medicaid Other | Source: Ambulatory Visit

## 2018-02-27 ENCOUNTER — Other Ambulatory Visit: Payer: Self-pay

## 2018-02-27 ENCOUNTER — Encounter (HOSPITAL_COMMUNITY): Payer: Self-pay | Admitting: Emergency Medicine

## 2018-02-27 ENCOUNTER — Emergency Department (HOSPITAL_COMMUNITY)
Admission: EM | Admit: 2018-02-27 | Discharge: 2018-02-27 | Disposition: A | Discharge status: Home / Self Care | Payer: Medicaid Other | Attending: Emergency Medicine | Admitting: Emergency Medicine

## 2018-02-27 DIAGNOSIS — M86142 Other acute osteomyelitis, left hand: Secondary | ICD-10-CM | POA: Insufficient documentation

## 2018-02-27 DIAGNOSIS — M869 Osteomyelitis, unspecified: Secondary | ICD-10-CM

## 2018-02-27 DIAGNOSIS — Z79899 Other long term (current) drug therapy: Secondary | ICD-10-CM | POA: Insufficient documentation

## 2018-02-27 MED ORDER — DIPHENHYDRAMINE HCL 50 MG/ML IJ SOLN
25.0000 mg | Freq: Once | INTRAMUSCULAR | Status: AC
  Administered 2018-02-27: 25 mg via INTRAVENOUS
  Filled 2018-02-27: qty 1

## 2018-02-27 MED ORDER — HEPARIN SOD (PORK) LOCK FLUSH 100 UNIT/ML IV SOLN
500.0000 [IU] | Freq: Once | INTRAVENOUS | Status: AC
  Administered 2018-02-27: 250 [IU]
  Filled 2018-02-27: qty 5

## 2018-02-27 MED ORDER — SODIUM CHLORIDE 0.9 % IV SOLN
700.0000 mg | INTRAVENOUS | Status: DC
  Administered 2018-02-27: 700 mg via INTRAVENOUS
  Filled 2018-02-27: qty 14

## 2018-02-27 NOTE — ED Provider Notes (Signed)
[facility] EMERGENCY DEPARTMENT Provider Note   CSN: [id number] Arrival date & time: 02/27/18  4098     History   Chief Complaint Chief Complaint  Patient presents with  . IV Medication    HPI Brian Erickson is a 52 y.o. male.  The history is provided by the patient. No language interpreter was used.   Brian Erickson is a 52 y.o. male who presents to the Emergency Department complaining of IV infusion. Presents for routine IV infusion. 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 His PICC line was placed on the 29th and he is scheduled for six weeks of daily infusions. The infusion center is closed today and the IV team is unable to accommodate his infusion so he presents to the emergency department for treatment. He denies any current symptoms. No fevers, chest pain, shortness of breath, night sweats, weight loss. Past Medical History:  Diagnosis Date  . Felon of finger of left hand 01/26/2018  . 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 GERD (gastroesophageal reflux disease)   . Gout   . Infection of thumb    left +MRSA  . MRSA (methicillin resistant staph aureus) culture positive    current infection left thumb    Patient Active Problem List   Diagnosis Date Noted  . Medication monitoring encounter 02/20/2018  . Rash and nonspecific skin eruption 02/20/2018  . 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 Felon of finger of left hand 01/26/2018  . Screening examination for STD (sexually transmitted disease) 12/24/2016    Past Surgical History:  Procedure Laterality Date  . I&D EXTREMITY Left 01/26/2018   Procedure: IRRIGATION AND DEBRIDEMENT LEFT THUMB;  Surgeon: Teryl Lucy, MD;  Location: [facility] OR;  Service: Orthopedics;  Laterality: Left;  . I&D EXTREMITY Left 02/12/2018   Procedure: IRRIGATION AND DEBRIDEMENT OF LEFT THUMB;  Surgeon: Teryl Lucy, MD;  Location: [facility];  Service: Orthopedics;   Laterality: Left;        Home Medications    Prior to Admission medications   Medication Sig Start Date End Date Taking? Authorizing Provider  acetaminophen (TYLENOL) 500 MG tablet Take 500 mg by mouth every 6 (six) hours as needed.    [provider]  allopurinol (ZYLOPRIM) 300 MG tablet Take 1 tablet (300 mg total) by mouth daily. 09/18/17   Loletta Specter, PA-C  colchicine 0.6 MG tablet Take 1 tablet (0.6 mg total) by mouth daily for 5 days. 12/28/17 02/11/18  Michela Pitcher A, PA-C  HYDROcodone-acetaminophen (NORCO) 5-325 MG tablet Take 1-2 tablets by mouth every 6 (six) hours as needed for moderate pain. MAXIMUM TOTAL ACETAMINOPHEN DOSE IS 4000 MG PER DAY 02/12/18   Teryl Lucy, MD  ibuprofen (ADVIL,MOTRIN) 600 MG tablet Take 1 tablet (600 mg total) by mouth every 6 (six) hours as needed. 01/23/18   Fayrene Helper, PA-C  ondansetron (ZOFRAN ODT) 4 MG disintegrating tablet Take 1 tablet (4 mg total) by mouth every 8 (eight) hours as needed for nausea or vomiting. Patient not taking: Reported on 02/13/2018 11/16/17   Street, [place], PA-C    Family History No family history on file.  Social History Social History   Tobacco Use  . Smoking status: Never Smoker  . Smokeless tobacco: Never Used  Substance Use Topics  . Alcohol use: No    Comment: socially   . Drug use: No     Allergies   Patient has no known  allergies.   Review of Systems Review of Systems  All other systems reviewed and are negative.    Physical Exam Updated Vital Signs BP (!) 159/92   Pulse 70   Temp 97.7 F (36.5 C) (Oral)   Resp 16   Ht 5' 9.5" (1.765 m)   Wt 78.9 kg (174 lb)   SpO2 98%   BMI 25.33 kg/m   Physical Exam  Constitutional: He is oriented to person, place, and time. He appears well-developed and well-nourished.  HENT:  Head: Normocephalic and atraumatic.  Cardiovascular: Normal rate and regular rhythm.  No murmur heard. Pulmonary/Chest: Effort normal and breath sounds  normal. No respiratory distress.  Abdominal: Soft. There is no tenderness. There is no rebound and no guarding.  Musculoskeletal: He exhibits no edema or tenderness.  Dressing in place to left thumb, C/D/I. PICC line to RUE.  Neurological: He is alert and oriented to person, place, and time.  Skin: Skin is warm and dry.  Scattered macules to LUE.    Psychiatric: He has a normal mood and affect. His behavior is normal.  Nursing note and vitals reviewed.    ED Treatments / Results  Labs (all labs ordered are listed, but only abnormal results are displayed) Labs Reviewed - No data to display  EKG None  Radiology No results found.  Procedures Procedures (including critical care time)  Medications Ordered in ED Medications - No data to display   Initial Impression / Assessment and Plan / ED Course  I have reviewed the triage vital signs and the nursing notes.  Pertinent labs & imaging results that were available during my care of the patient were reviewed by me and considered in my medical decision making (see chart for details).     Patient here for evaluation of routine antibiotic infusion for osteomyelitis. He is alert and well appearing on examination and in no acute distress. [facility] and ID physician regarding proper orders for antibiotics.  Patient discharged home following IV infusion for outpatient follow up.   Final Clinical Impressions(s) / ED Diagnoses   Final diagnoses:  Osteomyelitis, unspecified site, unspecified type [facility](HCC)    ED Discharge Orders    None       Tilden Fossaees, Tolbert Matheson, MD 02/27/18 1450

## 2018-02-27 NOTE — ED Notes (Signed)
Antibiotic infusion complete. Pt verbalized understanding of discharge instructions. Pt ambulatory to the waiting room.

## 2018-02-27 NOTE — ED Triage Notes (Signed)
Pt. Stated, Im here to get antibiotic through my PICC line. Short stay is closed today.

## 2018-02-27 NOTE — Discharge Instructions (Signed)
Get rechecked if you develop fevers, difficulty breathing, or new concerning symptoms.

## 2018-02-28 ENCOUNTER — Encounter (HOSPITAL_COMMUNITY)
Admission: RE | Admit: 2018-02-28 | Discharge: 2018-02-28 | Disposition: A | Discharge status: Home / Self Care | Payer: Self-pay | Source: Ambulatory Visit | Attending: Internal Medicine | Admitting: Internal Medicine

## 2018-02-28 MED ORDER — HEPARIN SOD (PORK) LOCK FLUSH 100 UNIT/ML IV SOLN
250.0000 [IU] | INTRAVENOUS | Status: DC | PRN
  Administered 2018-02-28: 250 [IU]

## 2018-02-28 MED ORDER — DIPHENHYDRAMINE HCL 50 MG/ML IJ SOLN
INTRAMUSCULAR | Status: AC
  Administered 2018-02-28: 25 mg
  Filled 2018-02-28: qty 1

## 2018-02-28 MED ORDER — SODIUM CHLORIDE 0.9 % IV SOLN
700.0000 mg | INTRAVENOUS | Status: DC
  Administered 2018-02-28: 700 mg via INTRAVENOUS
  Filled 2018-02-28: qty 14

## 2018-02-28 MED ORDER — DIPHENHYDRAMINE HCL 50 MG/ML IJ SOLN: 25.0000 mg | Freq: Once | INTRAMUSCULAR | Status: DC

## 2018-02-28 MED ORDER — HEPARIN SOD (PORK) LOCK FLUSH 100 UNIT/ML IV SOLN
INTRAVENOUS | Status: AC
  Filled 2018-02-28: qty 5

## 2018-02-28 MED ORDER — HEPARIN SOD (PORK) LOCK FLUSH 100 UNIT/ML IV SOLN
INTRAVENOUS | Status: AC
  Administered 2018-02-28: 250 [IU]
  Filled 2018-02-28: qty 5

## 2018-03-01 ENCOUNTER — Encounter (HOSPITAL_COMMUNITY)
Admission: RE | Admit: 2018-03-01 | Discharge: 2018-03-01 | Disposition: A | Discharge status: Home / Self Care | Payer: Self-pay | Source: Ambulatory Visit | Attending: Internal Medicine | Admitting: Internal Medicine

## 2018-03-01 LAB — CBC WITH DIFFERENTIAL/PLATELET
Abs Immature Granulocytes: 0 10*3/uL (ref 0.0–0.1)
Basophils Absolute: 0.1 10*3/uL (ref 0.0–0.1)
Basophils Relative: 1 %
EOS ABS: 0.2 10*3/uL (ref 0.0–0.7)
EOS PCT: 3 %
HCT: 45.7 % (ref 39.0–52.0)
HEMOGLOBIN: 14.6 g/dL (ref 13.0–17.0)
Immature Granulocytes: 1 %
LYMPHS ABS: 1.8 10*3/uL (ref 0.7–4.0)
LYMPHS PCT: 32 %
MCH: 28.9 pg (ref 26.0–34.0)
MCHC: 31.9 g/dL (ref 30.0–36.0)
MCV: 90.5 fL (ref 78.0–100.0)
MONO ABS: 0.3 10*3/uL (ref 0.1–1.0)
MONOS PCT: 6 %
Neutro Abs: 3.2 10*3/uL (ref 1.7–7.7)
Neutrophils Relative %: 57 %
Platelets: 261 10*3/uL (ref 150–400)
RBC: 5.05 MIL/uL (ref 4.22–5.81)
RDW: 13.2 % (ref 11.5–15.5)
WBC: 5.5 10*3/uL (ref 4.0–10.5)

## 2018-03-01 LAB — BASIC METABOLIC PANEL
Anion gap: 7 (ref 5–15)
BUN: 10 mg/dL (ref 6–20)
CHLORIDE: 105 mmol/L (ref 98–111)
CO2: 27 mmol/L (ref 22–32)
CREATININE: 1.02 mg/dL (ref 0.61–1.24)
Calcium: 8.9 mg/dL (ref 8.9–10.3)
GFR calc Af Amer: >60 mL/min (ref >60)
GFR calc non Af Amer: >60 mL/min (ref >60)
Glucose, Bld: 99 mg/dL (ref 70–99)
POTASSIUM: 3.7 mmol/L (ref 3.5–5.1)
SODIUM: 139 mmol/L (ref 135–145)

## 2018-03-01 LAB — SEDIMENTATION RATE: Sed Rate: 4 mm/hr (ref 0–16)

## 2018-03-01 LAB — CK: Total CK: 9121 U/L — ABNORMAL HIGH (ref 49–397)

## 2018-03-01 LAB — C-REACTIVE PROTEIN: CRP: 1.4 mg/dL — ABNORMAL HIGH (ref <1.0)

## 2018-03-01 MED ORDER — SODIUM CHLORIDE 0.9 % IV SOLN
700.0000 mg | INTRAVENOUS | Status: DC
  Administered 2018-03-01: 700 mg via INTRAVENOUS
  Filled 2018-03-01: qty 14

## 2018-03-01 MED ORDER — HEPARIN SOD (PORK) LOCK FLUSH 100 UNIT/ML IV SOLN
250.0000 [IU] | INTRAVENOUS | Status: AC | PRN
  Administered 2018-03-01: 250 [IU]

## 2018-03-01 MED ORDER — DIPHENHYDRAMINE HCL 50 MG/ML IJ SOLN
25.0000 mg | Freq: Once | INTRAMUSCULAR | Status: AC
  Administered 2018-03-01: 25 mg via INTRAVENOUS
  Filled 2018-03-01: qty 1

## 2018-03-02 ENCOUNTER — Encounter (HOSPITAL_COMMUNITY)
Admission: RE | Admit: 2018-03-02 | Discharge: 2018-03-02 | Disposition: A | Discharge status: Home / Self Care | Payer: Self-pay | Source: Ambulatory Visit | Attending: Internal Medicine | Admitting: Internal Medicine

## 2018-03-02 MED ORDER — SODIUM CHLORIDE 0.9 % IV SOLN
700.0000 mg | INTRAVENOUS | Status: DC
  Administered 2018-03-02: 700 mg via INTRAVENOUS
  Filled 2018-03-02: qty 14

## 2018-03-02 MED ORDER — DIPHENHYDRAMINE HCL 50 MG/ML IJ SOLN
25.0000 mg | Freq: Once | INTRAMUSCULAR | Status: AC
  Administered 2018-03-02: 25 mg via INTRAVENOUS
  Filled 2018-03-02: qty 1

## 2018-03-02 MED ORDER — HEPARIN SOD (PORK) LOCK FLUSH 100 UNIT/ML IV SOLN
250.0000 [IU] | INTRAVENOUS | Status: AC | PRN
  Administered 2018-03-02: 250 [IU]
  Filled 2018-03-02: qty 5

## 2018-03-03 ENCOUNTER — Ambulatory Visit (HOSPITAL_COMMUNITY)
Admission: RE | Admit: 2018-03-03 | Discharge: 2018-03-03 | Disposition: A | Discharge status: Home / Self Care | Payer: Medicaid Other | Source: Ambulatory Visit | Attending: Internal Medicine | Admitting: Internal Medicine

## 2018-03-03 DIAGNOSIS — M869 Osteomyelitis, unspecified: Secondary | ICD-10-CM | POA: Insufficient documentation

## 2018-03-03 MED ORDER — SODIUM CHLORIDE 0.9 % IV SOLN
700.0000 mg | INTRAVENOUS | Status: DC
  Administered 2018-03-03: 700 mg via INTRAVENOUS
  Filled 2018-03-03: qty 14

## 2018-03-03 MED ORDER — DIPHENHYDRAMINE HCL 50 MG/ML IJ SOLN: 25.0000 mg | Freq: Once | INTRAMUSCULAR | Status: DC

## 2018-03-03 MED ORDER — HEPARIN SOD (PORK) LOCK FLUSH 100 UNIT/ML IV SOLN
INTRAVENOUS | Status: AC
  Administered 2018-03-03: 250 [IU]
  Filled 2018-03-03: qty 5

## 2018-03-03 MED ORDER — DIPHENHYDRAMINE HCL 50 MG/ML IJ SOLN
INTRAMUSCULAR | Status: AC
  Administered 2018-03-03: 25 mg
  Filled 2018-03-03: qty 1

## 2018-03-04 ENCOUNTER — Encounter (HOSPITAL_COMMUNITY)
Admission: RE | Admit: 2018-03-04 | Discharge: 2018-03-04 | Disposition: A | Discharge status: Home / Self Care | Payer: Medicaid Other | Source: Ambulatory Visit | Attending: Internal Medicine | Admitting: Internal Medicine

## 2018-03-04 ENCOUNTER — Other Ambulatory Visit: Payer: Self-pay

## 2018-03-04 ENCOUNTER — Ambulatory Visit (INDEPENDENT_AMBULATORY_CARE_PROVIDER_SITE_OTHER): Payer: Medicaid Other | Admitting: Internal Medicine

## 2018-03-04 ENCOUNTER — Telehealth: Payer: Self-pay | Admitting: *Deleted

## 2018-03-04 ENCOUNTER — Encounter: Payer: Self-pay | Admitting: Internal Medicine

## 2018-03-04 VITALS — BP 162/93 | HR 63 | Wt 178.0 lb

## 2018-03-04 DIAGNOSIS — M869 Osteomyelitis, unspecified: Secondary | ICD-10-CM

## 2018-03-04 DIAGNOSIS — M109 Gout, unspecified: Secondary | ICD-10-CM | POA: Insufficient documentation

## 2018-03-04 DIAGNOSIS — Z5181 Encounter for therapeutic drug level monitoring: Secondary | ICD-10-CM | POA: Insufficient documentation

## 2018-03-04 DIAGNOSIS — A4902 Methicillin resistant Staphylococcus aureus infection, unspecified site: Secondary | ICD-10-CM | POA: Insufficient documentation

## 2018-03-04 DIAGNOSIS — R21 Rash and other nonspecific skin eruption: Secondary | ICD-10-CM | POA: Insufficient documentation

## 2018-03-04 DIAGNOSIS — Z452 Encounter for adjustment and management of vascular access device: Secondary | ICD-10-CM

## 2018-03-04 DIAGNOSIS — K219 Gastro-esophageal reflux disease without esophagitis: Secondary | ICD-10-CM | POA: Insufficient documentation

## 2018-03-04 MED ORDER — DIPHENHYDRAMINE HCL 50 MG/ML IJ SOLN
INTRAMUSCULAR | Status: AC
  Administered 2018-03-04: 25 mg via INTRAVENOUS
  Filled 2018-03-04: qty 1

## 2018-03-04 MED ORDER — LINEZOLID 600 MG PO TABS: 600.0000 mg | ORAL_TABLET | Freq: Two times a day (BID) | ORAL | 0 refills | Status: DC

## 2018-03-04 MED ORDER — HEPARIN SOD (PORK) LOCK FLUSH 100 UNIT/ML IV SOLN
INTRAVENOUS | Status: AC
  Administered 2018-03-04: 250 [IU]
  Filled 2018-03-04: qty 5

## 2018-03-04 MED ORDER — SODIUM CHLORIDE 0.9 % IV SOLN
700.0000 mg | INTRAVENOUS | Status: DC
  Administered 2018-03-04: 700 mg via INTRAVENOUS
  Filled 2018-03-04: qty 14

## 2018-03-04 MED ORDER — DIPHENHYDRAMINE HCL 50 MG/ML IJ SOLN
25.0000 mg | Freq: Once | INTRAMUSCULAR | Status: AC
  Administered 2018-03-04: 25 mg via INTRAVENOUS

## 2018-03-04 NOTE — Telephone Encounter (Signed)
[facility]. Patient had just completed his IV infusion of daptomycin, was still at the infusion center.  RN relayed verbal order to cancel future infusions per Dr Luciana Axeomer, asked her to send patient to [facility] for work in.  Patient will walk over now. Andree CossHowell, Cherrell Maybee M, RN

## 2018-03-04 NOTE — Progress Notes (Signed)
Per verbal order from Dr Luciana Axeomer, 37 cm Single Lumen Peripherally Inserted Central Catheter removed from right basilic, tip intact. No sutures present. RN confirmed length per chart. Dressing was clean and dry. Petroleum dressing applied. Pt advised no heavy lifting with this arm, leave dressing for 24 hours and call the office or seek emergent care if dressing becomes soaked with blood, or swelling or sharp pain presents. Patient verbalized understanding and agreement.  Patient's questions answered to their satisfaction. Patient tolerated procedure well, RN walked patient to check out. Andree CossHowell, Ziyan Schoon M, RN

## 2018-03-04 NOTE — Assessment & Plan Note (Signed)
Improved, not c/w drug rash.

## 2018-03-04 NOTE — Assessment & Plan Note (Addendum)
Will need to change therapy and will use zyvox per patient assistance.  He will get weekly labs to assure no thrombocytopenia, luekopenia.  Will monitor for peripheral neuropathy.   He will follow up with me August 2.

## 2018-03-04 NOTE — Assessment & Plan Note (Signed)
picc line removed today in clinic.

## 2018-03-04 NOTE — Progress Notes (Signed)
HPI: Brian Erickson is a 52 y.o. male who presents to the [facility] clinic today to follow-up with Dr. Luciana Axeomer for his osteomyelitis of his thumb.  Patient Active Problem List   Diagnosis Date Noted  . Medication monitoring encounter 02/20/2018  . Rash and nonspecific skin eruption 02/20/2018  . 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 Felon of finger of left hand 01/26/2018  . Screening examination for STD (sexually transmitted disease) 12/24/2016    Patient's Medications  New Prescriptions   No medications on file  Previous Medications   ACETAMINOPHEN (TYLENOL) 500 MG TABLET    Take 500 mg by mouth every 6 (six) hours as needed.   ALLOPURINOL (ZYLOPRIM) 300 MG TABLET    Take 1 tablet (300 mg total) by mouth daily.   HYDROCODONE-ACETAMINOPHEN (NORCO) 5-325 MG TABLET    Take 1-2 tablets by mouth every 6 (six) hours as needed for moderate pain. MAXIMUM TOTAL ACETAMINOPHEN DOSE IS 4000 MG PER DAY   IBUPROFEN (ADVIL,MOTRIN) 600 MG TABLET    Take 1 tablet (600 mg total) by mouth every 6 (six) hours as needed.  Modified Medications   No medications on file  Discontinued Medications   COLCHICINE 0.6 MG TABLET    Take 1 tablet (0.6 mg total) by mouth daily for 5 days.   ONDANSETRON (ZOFRAN ODT) 4 MG DISINTEGRATING TABLET    Take 1 tablet (4 mg total) by mouth every 8 (eight) hours as needed for nausea or vomiting.    Allergies: Allergies  Allergen Reactions  . Daptomycin     Rhabdomyolysis     Past Medical History: Past Medical History:  Diagnosis Date  . Felon of finger of left hand 01/26/2018  . 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 GERD (gastroesophageal reflux disease)   . Gout   . Infection of thumb    left +MRSA  . MRSA (methicillin resistant staph aureus) culture positive    current infection left thumb    Social History: Social History   Socioeconomic History  . Marital status: Single    Spouse name: Not on file  . Number of children: Not on file  .  Years of education: Not on file  . Highest education level: Not on file  Occupational History  . Not on file  Social Needs  . Financial resource strain: Not on file  . Food insecurity:    Worry: Not on file    Inability: Not on file  . Transportation needs:    Medical: Not on file    Non-medical: Not on file  Tobacco Use  . Smoking status: Never Smoker  . Smokeless tobacco: Never Used  Substance and Sexual Activity  . Alcohol use: No    Comment: socially   . Drug use: No  . Sexual activity: Not on file  Lifestyle  . Physical activity:    Days per week: Not on file    Minutes per session: Not on file  . Stress: Not on file  Relationships  . Social connections:    Talks on phone: Not on file    Gets together: Not on file    Attends religious service: Not on file    Active member of club or organization: Not on file    Attends meetings of clubs or organizations: Not on file    Relationship status: Not on file  Other Topics Concern  . Not on file  Social History Narrative  . Not on file    Labs:  No results found for: HIV1RNAQUANT, HIV1RNAVL, CD4TABS  RPR and STI Lab Results  Component Value Date   LABRPR Non Reactive 12/24/2016    STI Results GC CT  12/24/2016 Negative Negative    Hepatitis B No results found for: HEPBSAB, HEPBSAG, HEPBCAB Hepatitis C No results found for: HEPCAB, HCVRNAPCRQN Hepatitis A No results found for: HAV Lipids: No results found for: CHOL, TRIG, HDL, CHOLHDL, VLDL, LDLCALC   Assessment: Dat is here today to see Dr. Luciana Axe for follow-up of his finger osteomyelitis. He was started on daptomycin but developed CK levels up to 9,000.  He comes in today to see Dr. Luciana Axe and stop his IV antibiotics.  Dr. Luciana Axe would like to start him on Zyvox for ~1 month instead.  The patient is uninsured, so I will apply through the drug company to get him a free 30 day supply.  [employer] will ship the medication to his house.  Plan: - Stop  Daptomycin - Start Linezolid 600 mg PO BID x 30 days - Weekly labs  Abdiaziz Klahn L. Theo Krumholz, PharmD, AAHIVP, CPP Infectious Diseases Clinical Pharmacist [facility] 03/04/2018, 11:00 AM

## 2018-03-04 NOTE — Telephone Encounter (Signed)
-----   Message from Gardiner Barefootobert W Comer, MD sent at 03/03/2018 10:49 AM EDT ----- Please tell him to stop the daptomycin (IV infusion at short stay) and have him come see me tomorrow for an alternative treatment.  His CK is very elevated.  Tell him to hydrate.   thanks

## 2018-03-04 NOTE — Assessment & Plan Note (Signed)
Elevated CK.  daptomycin now listed as allergy.  Stopped and patient told to hydrate extra.  Will recheck now and weekly.

## 2018-03-04 NOTE — Progress Notes (Signed)
   Subjective:    Patient ID: Brian Erickson, male    DOB: 04/14/1966, 52 y.o.   MRN: [id number]  HPI Here for a work in visit.   He is on daptomycin for left thumb osteomyelitis, s/p I and D by Dr. Dion SaucierLandau.  He has been on daptomycin for over 2 weeks at short stay.  He initially developed a rash that was puritic, and has been getting Benadryl with the infusion.  His CK last week was up to 804 but he was asymptomatic with no myalgias.  Yesterday his CK was up to over 9,000.  Still no myalgias.  Was called to stop, though did get today's dose.  Rash improved. Stop date was July 31st.     Review of Systems  Constitutional: Negative for fatigue.  Musculoskeletal: Negative for myalgias.  Psychiatric/Behavioral: Negative for sleep disturbance.       Objective:   Physical Exam  Constitutional: He appears well-developed and well-nourished. No distress.  Cardiovascular: Normal rate, regular rhythm and normal heart sounds.  No murmur heard. Pulmonary/Chest: Effort normal and breath sounds normal. No respiratory distress.  Lymphadenopathy:    He has no cervical adenopathy.  Skin: Rash noted.  Small areas of dried papules.  Not confluent.      SH: no tobacco      Assessment & Plan:

## 2018-03-05 ENCOUNTER — Encounter (HOSPITAL_COMMUNITY): Payer: Medicaid Other

## 2018-03-05 LAB — COMPREHENSIVE METABOLIC PANEL
AG Ratio: 1.5 (calc) (ref 1.0–2.5)
ALBUMIN MSPROF: 4.1 g/dL (ref 3.6–5.1)
ALT: 53 U/L — ABNORMAL HIGH (ref 9–46)
AST: 73 U/L — ABNORMAL HIGH (ref 10–35)
Alkaline phosphatase (APISO): 100 U/L (ref 40–115)
BUN: 7 mg/dL (ref 7–25)
CO2: 24 mmol/L (ref 20–32)
Calcium: 9.4 mg/dL (ref 8.6–10.3)
Chloride: 104 mmol/L (ref 98–110)
Creat: 1.08 mg/dL (ref 0.70–1.33)
GLUCOSE: 95 mg/dL (ref 65–99)
Globulin: 2.8 g/dL (calc) (ref 1.9–3.7)
POTASSIUM: 3.9 mmol/L (ref 3.5–5.3)
SODIUM: 139 mmol/L (ref 135–146)
TOTAL PROTEIN: 6.9 g/dL (ref 6.1–8.1)
Total Bilirubin: 0.4 mg/dL (ref 0.2–1.2)

## 2018-03-05 LAB — CBC WITH DIFFERENTIAL/PLATELET
BASOS ABS: 90 {cells}/uL (ref 0–200)
BASOS PCT: 1.8 %
EOS ABS: 200 {cells}/uL (ref 15–500)
Eosinophils Relative: 4 %
HEMATOCRIT: 44.5 % (ref 38.5–50.0)
Hemoglobin: 15.3 g/dL (ref 13.2–17.1)
Lymphs Abs: 1985 cells/uL (ref 850–3900)
MCH: 29.3 pg (ref 27.0–33.0)
MCHC: 34.4 g/dL (ref 32.0–36.0)
MCV: 85.1 fL (ref 80.0–100.0)
MONOS PCT: 7.3 %
MPV: 9.8 fL (ref 7.5–12.5)
Neutro Abs: 2360 cells/uL (ref 1500–7800)
Neutrophils Relative %: 47.2 %
Platelets: 323 10*3/uL (ref 140–400)
RBC: 5.23 10*6/uL (ref 4.20–5.80)
RDW: 13.1 % (ref 11.0–15.0)
Total Lymphocyte: 39.7 %
WBC: 5 10*3/uL (ref 3.8–10.8)
WBCMIX: 365 {cells}/uL (ref 200–950)

## 2018-03-05 LAB — CK: Total CK: 4250 U/L — ABNORMAL HIGH (ref 44–196)

## 2018-03-06 ENCOUNTER — Encounter (HOSPITAL_COMMUNITY): Payer: Medicaid Other

## 2018-03-06 ENCOUNTER — Ambulatory Visit: Payer: Medicaid Other | Admitting: Internal Medicine

## 2018-03-07 ENCOUNTER — Encounter (HOSPITAL_COMMUNITY): Payer: Medicaid Other

## 2018-03-08 ENCOUNTER — Encounter (HOSPITAL_COMMUNITY): Payer: Medicaid Other

## 2018-03-09 ENCOUNTER — Encounter (HOSPITAL_COMMUNITY): Payer: Medicaid Other

## 2018-03-10 ENCOUNTER — Encounter (HOSPITAL_COMMUNITY): Payer: Medicaid Other

## 2018-03-10 ENCOUNTER — Telehealth: Payer: Self-pay | Admitting: Pharmacist

## 2018-03-10 NOTE — Telephone Encounter (Signed)
Called patient to see how he was doing on Linezolid and to make sure he received it from the drug company last week.  Number listed keeps going straight to voicemail and his mailbox is full. Hopefully he got it.

## 2018-03-11 ENCOUNTER — Encounter (HOSPITAL_COMMUNITY): Payer: Medicaid Other

## 2018-03-11 ENCOUNTER — Other Ambulatory Visit: Payer: Medicaid Other

## 2018-03-11 DIAGNOSIS — M869 Osteomyelitis, unspecified: Secondary | ICD-10-CM

## 2018-03-12 ENCOUNTER — Encounter (HOSPITAL_COMMUNITY): Payer: Medicaid Other

## 2018-03-12 LAB — CBC WITH DIFFERENTIAL/PLATELET
BASOS ABS: 48 {cells}/uL (ref 0–200)
Basophils Relative: 0.9 %
Eosinophils Absolute: 143 cells/uL (ref 15–500)
Eosinophils Relative: 2.7 %
HCT: 46.7 % (ref 38.5–50.0)
HEMOGLOBIN: 16 g/dL (ref 13.2–17.1)
Lymphs Abs: 1982 cells/uL (ref 850–3900)
MCH: 29.3 pg (ref 27.0–33.0)
MCHC: 34.3 g/dL (ref 32.0–36.0)
MCV: 85.4 fL (ref 80.0–100.0)
MONOS PCT: 6.6 %
MPV: 9.7 fL (ref 7.5–12.5)
NEUTROS ABS: 2777 {cells}/uL (ref 1500–7800)
Neutrophils Relative %: 52.4 %
Platelets: 360 10*3/uL (ref 140–400)
RBC: 5.47 10*6/uL (ref 4.20–5.80)
RDW: 13.2 % (ref 11.0–15.0)
Total Lymphocyte: 37.4 %
WBC mixed population: 350 cells/uL (ref 200–950)
WBC: 5.3 10*3/uL (ref 3.8–10.8)

## 2018-03-12 LAB — COMPREHENSIVE METABOLIC PANEL
AG Ratio: 1.5 (calc) (ref 1.0–2.5)
ALBUMIN MSPROF: 4.4 g/dL (ref 3.6–5.1)
ALKALINE PHOSPHATASE (APISO): 71 U/L (ref 40–115)
ALT: 28 U/L (ref 9–46)
AST: 24 U/L (ref 10–35)
BUN / CREAT RATIO: 8 (calc) (ref 6–22)
BUN: 11 mg/dL (ref 7–25)
CO2: 26 mmol/L (ref 20–32)
CREATININE: 1.43 mg/dL — AB (ref 0.70–1.33)
Calcium: 10 mg/dL (ref 8.6–10.3)
Chloride: 106 mmol/L (ref 98–110)
GLUCOSE: 83 mg/dL (ref 65–99)
Globulin: 2.9 g/dL (calc) (ref 1.9–3.7)
POTASSIUM: 4.1 mmol/L (ref 3.5–5.3)
Sodium: 141 mmol/L (ref 135–146)
Total Bilirubin: 0.6 mg/dL (ref 0.2–1.2)
Total Protein: 7.3 g/dL (ref 6.1–8.1)

## 2018-03-12 LAB — CK: Total CK: 672 U/L — ABNORMAL HIGH (ref 44–196)

## 2018-03-13 ENCOUNTER — Encounter (HOSPITAL_COMMUNITY): Payer: Medicaid Other

## 2018-03-14 ENCOUNTER — Encounter (HOSPITAL_COMMUNITY): Payer: Medicaid Other

## 2018-03-15 ENCOUNTER — Encounter (HOSPITAL_COMMUNITY): Payer: Medicaid Other

## 2018-03-16 ENCOUNTER — Encounter (HOSPITAL_COMMUNITY): Payer: Medicaid Other

## 2018-03-17 ENCOUNTER — Encounter (HOSPITAL_COMMUNITY): Payer: Medicaid Other

## 2018-03-18 ENCOUNTER — Other Ambulatory Visit: Payer: Self-pay

## 2018-03-18 ENCOUNTER — Encounter (HOSPITAL_COMMUNITY): Payer: Medicaid Other

## 2018-03-18 DIAGNOSIS — M869 Osteomyelitis, unspecified: Secondary | ICD-10-CM

## 2018-03-19 ENCOUNTER — Encounter (HOSPITAL_COMMUNITY): Payer: Medicaid Other

## 2018-03-19 LAB — COMPREHENSIVE METABOLIC PANEL
AG Ratio: 1.5 (calc) (ref 1.0–2.5)
ALBUMIN MSPROF: 4.6 g/dL (ref 3.6–5.1)
ALT: 16 U/L (ref 9–46)
AST: 17 U/L (ref 10–35)
Alkaline phosphatase (APISO): 73 U/L (ref 40–115)
BUN: 14 mg/dL (ref 7–25)
CHLORIDE: 108 mmol/L (ref 98–110)
CO2: 24 mmol/L (ref 20–32)
CREATININE: 1.28 mg/dL (ref 0.70–1.33)
Calcium: 9.7 mg/dL (ref 8.6–10.3)
GLOBULIN: 3 g/dL (ref 1.9–3.7)
GLUCOSE: 113 mg/dL — AB (ref 65–99)
POTASSIUM: 4 mmol/L (ref 3.5–5.3)
SODIUM: 140 mmol/L (ref 135–146)
TOTAL PROTEIN: 7.6 g/dL (ref 6.1–8.1)
Total Bilirubin: 0.5 mg/dL (ref 0.2–1.2)

## 2018-03-19 LAB — CBC WITH DIFFERENTIAL/PLATELET
Basophils Absolute: 49 cells/uL (ref 0–200)
Basophils Relative: 0.8 %
EOS PCT: 1.3 %
Eosinophils Absolute: 79 cells/uL (ref 15–500)
HEMATOCRIT: 46.3 % (ref 38.5–50.0)
HEMOGLOBIN: 15.8 g/dL (ref 13.2–17.1)
LYMPHS ABS: 1958 {cells}/uL (ref 850–3900)
MCH: 29.3 pg (ref 27.0–33.0)
MCHC: 34.1 g/dL (ref 32.0–36.0)
MCV: 85.9 fL (ref 80.0–100.0)
MPV: 10.2 fL (ref 7.5–12.5)
Monocytes Relative: 6.4 %
NEUTROS ABS: 3623 {cells}/uL (ref 1500–7800)
Neutrophils Relative %: 59.4 %
Platelets: 289 10*3/uL (ref 140–400)
RBC: 5.39 10*6/uL (ref 4.20–5.80)
RDW: 13.9 % (ref 11.0–15.0)
Total Lymphocyte: 32.1 %
WBC mixed population: 390 cells/uL (ref 200–950)
WBC: 6.1 10*3/uL (ref 3.8–10.8)

## 2018-03-19 LAB — CK: CK TOTAL: 369 U/L — AB (ref 44–196)

## 2018-03-20 ENCOUNTER — Encounter (HOSPITAL_COMMUNITY): Payer: Medicaid Other

## 2018-03-21 ENCOUNTER — Encounter (HOSPITAL_COMMUNITY): Payer: Medicaid Other

## 2018-03-22 ENCOUNTER — Encounter (HOSPITAL_COMMUNITY): Payer: Medicaid Other

## 2018-03-23 ENCOUNTER — Encounter (HOSPITAL_COMMUNITY): Payer: Medicaid Other

## 2018-03-24 ENCOUNTER — Encounter (HOSPITAL_COMMUNITY): Payer: Medicaid Other

## 2018-03-25 ENCOUNTER — Other Ambulatory Visit: Payer: Self-pay

## 2018-03-25 ENCOUNTER — Encounter (HOSPITAL_COMMUNITY): Payer: Medicaid Other

## 2018-03-25 DIAGNOSIS — M869 Osteomyelitis, unspecified: Secondary | ICD-10-CM

## 2018-03-26 ENCOUNTER — Encounter (HOSPITAL_COMMUNITY): Payer: Medicaid Other

## 2018-03-26 LAB — COMPREHENSIVE METABOLIC PANEL
AG Ratio: 1.6 (calc) (ref 1.0–2.5)
ALBUMIN MSPROF: 4.6 g/dL (ref 3.6–5.1)
ALT: 14 U/L (ref 9–46)
AST: 18 U/L (ref 10–35)
Alkaline phosphatase (APISO): 75 U/L (ref 40–115)
BUN: 11 mg/dL (ref 7–25)
CO2: 27 mmol/L (ref 20–32)
CREATININE: 1.29 mg/dL (ref 0.70–1.33)
Calcium: 9.7 mg/dL (ref 8.6–10.3)
Chloride: 106 mmol/L (ref 98–110)
GLOBULIN: 2.8 g/dL (ref 1.9–3.7)
Glucose, Bld: 156 mg/dL — ABNORMAL HIGH (ref 65–99)
POTASSIUM: 3.7 mmol/L (ref 3.5–5.3)
SODIUM: 142 mmol/L (ref 135–146)
TOTAL PROTEIN: 7.4 g/dL (ref 6.1–8.1)
Total Bilirubin: 0.9 mg/dL (ref 0.2–1.2)

## 2018-03-26 LAB — CBC WITH DIFFERENTIAL/PLATELET
BASOS ABS: 62 {cells}/uL (ref 0–200)
Basophils Relative: 1.1 %
EOS PCT: 1.6 %
Eosinophils Absolute: 90 cells/uL (ref 15–500)
HEMATOCRIT: 46.2 % (ref 38.5–50.0)
HEMOGLOBIN: 15.9 g/dL (ref 13.2–17.1)
LYMPHS ABS: 1898 {cells}/uL (ref 850–3900)
MCH: 29.1 pg (ref 27.0–33.0)
MCHC: 34.4 g/dL (ref 32.0–36.0)
MCV: 84.6 fL (ref 80.0–100.0)
MPV: 10.2 fL (ref 7.5–12.5)
Monocytes Relative: 6.4 %
NEUTROS ABS: 3192 {cells}/uL (ref 1500–7800)
NEUTROS PCT: 57 %
Platelets: 297 10*3/uL (ref 140–400)
RBC: 5.46 10*6/uL (ref 4.20–5.80)
RDW: 13.9 % (ref 11.0–15.0)
Total Lymphocyte: 33.9 %
WBC: 5.6 10*3/uL (ref 3.8–10.8)
WBCMIX: 358 {cells}/uL (ref 200–950)

## 2018-03-26 LAB — CK: CK TOTAL: 427 U/L — AB (ref 44–196)

## 2018-03-28 ENCOUNTER — Ambulatory Visit: Payer: Medicaid Other | Admitting: Internal Medicine

## 2018-03-31 ENCOUNTER — Telehealth: Payer: Self-pay | Admitting: *Deleted

## 2018-03-31 ENCOUNTER — Telehealth: Payer: Self-pay | Admitting: Internal Medicine

## 2018-03-31 NOTE — Telephone Encounter (Signed)
Patient called as he missed appointment 03/28/18 and needs to have his finger looked at. Advised him Luciana AxeComer is not back in the office until 04/15/18 but we can have him see one of our NP's. He agreed to see an NP so gave him an appointment with Tammy SoursGreg for 04/01/18 at 3 pm.

## 2018-04-01 ENCOUNTER — Ambulatory Visit: Payer: Medicaid Other | Admitting: Family

## 2018-04-01 NOTE — Progress Notes (Deleted)
   Subjective:    Patient ID: Brian Erickson Mertz, male    DOB: 05/02/1966, 52 y.o.   MRN: [id number]  No chief complaint on file.    HPI:  Brian Erickson Brabant is a 52 y.o. male who presents today for routine follow up of osteomyelitis.   Mr. Georgina PillionMassey was last seen in the office on 04/01/18 by Dr. Luciana Axeomer for left thumb osteomyelitis s/p I&D and treatment with 2 weeks of Daptomycin. His CK levels increased and was changed to Linezolid for MRSA osteomyelitis.    Allergies  Allergen Reactions  . Daptomycin     Rhabdomyolysis       Outpatient Medications Prior to Visit  Medication Sig Dispense Refill  . acetaminophen (TYLENOL) 500 MG tablet Take 500 mg by mouth every 6 (six) hours as needed.    Marland Kitchen. allopurinol (ZYLOPRIM) 300 MG tablet Take 1 tablet (300 mg total) by mouth daily. 30 tablet 6  . HYDROcodone-acetaminophen (NORCO) 5-325 MG tablet Take 1-2 tablets by mouth every 6 (six) hours as needed for moderate pain. MAXIMUM TOTAL ACETAMINOPHEN DOSE IS 4000 MG PER DAY 30 tablet 0  . ibuprofen (ADVIL,MOTRIN) 600 MG tablet Take 1 tablet (600 mg total) by mouth every 6 (six) hours as needed. 30 tablet 0  . linezolid (ZYVOX) 600 MG tablet Take 1 tablet (600 mg total) by mouth 2 (two) times daily. 56 tablet 0   No facility-administered medications prior to visit.      Past Medical History:  Diagnosis Date  . Felon of finger of left hand 01/26/2018  . 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 GERD (gastroesophageal reflux disease)   . Gout   . Infection of thumb    left +MRSA  . MRSA (methicillin resistant staph aureus) culture positive    current infection left thumb     Past Surgical History:  Procedure Laterality Date  . I&D EXTREMITY Left 01/26/2018   Procedure: IRRIGATION AND DEBRIDEMENT LEFT THUMB;  Surgeon: Teryl LucyLandau, Joshua, MD;  Location: [facility] OR;  Service: Orthopedics;  Laterality: Left;  . I&D EXTREMITY Left 02/12/2018   Procedure: IRRIGATION AND DEBRIDEMENT OF LEFT THUMB;  Surgeon:  Teryl LucyLandau, Joshua, MD;  Location: [facility];  Service: Orthopedics;  Laterality: Left;       Review of Systems    Objective:    There were no vitals taken for this visit. Nursing note and vital signs reviewed.  Physical Exam     Assessment & Plan:   Problem List Items Addressed This Visit    None       I am having Brian Erickson. Levan maintain his allopurinol, ibuprofen, acetaminophen, HYDROcodone-acetaminophen, and linezolid.   No orders of the defined types were placed in this encounter.    Follow-up: No follow-ups on file.   Marcos EkeGreg Calone, MSN, FNP-C Nurse Practitioner [facility] Office phone: [phone] Pager: [phone] [facility] Main number: [phone]

## 2018-04-14 ENCOUNTER — Encounter: Payer: Self-pay | Admitting: Internal Medicine

## 2018-04-14 ENCOUNTER — Ambulatory Visit (INDEPENDENT_AMBULATORY_CARE_PROVIDER_SITE_OTHER): Payer: Medicaid Other | Admitting: Internal Medicine

## 2018-04-14 VITALS — BP 132/87 | HR 92 | Temp 97.4°F | Ht 69.0 in | Wt 170.1 lb

## 2018-04-14 DIAGNOSIS — Z452 Encounter for adjustment and management of vascular access device: Secondary | ICD-10-CM

## 2018-04-14 DIAGNOSIS — R21 Rash and other nonspecific skin eruption: Secondary | ICD-10-CM

## 2018-04-14 DIAGNOSIS — M869 Osteomyelitis, unspecified: Secondary | ICD-10-CM

## 2018-04-14 DIAGNOSIS — Z5181 Encounter for therapeutic drug level monitoring: Secondary | ICD-10-CM

## 2018-04-14 NOTE — Assessment & Plan Note (Signed)
Thumb looks healed and using it without issues.  Told him to stop the linezolid and can rtc as needed.  Precautions of new redness, warmth and swelling in the area.

## 2018-04-14 NOTE — Assessment & Plan Note (Signed)
Slow improvement.  Hopefully off all antibiotics will resolve

## 2018-04-14 NOTE — Assessment & Plan Note (Signed)
Has been removed.

## 2018-04-14 NOTE — Progress Notes (Signed)
   Subjective:    Patient ID: Azalee CourseFrancis E Winstanley, male    DOB: 06/28/1966, 52 y.o.   MRN: [id number]  HPI Here for a work in visit.   He was on daptomycin for left thumb osteomyelitis, s/p I and D by Dr. Dion SaucierLandau but developed significant elevation of his CK.  He also had issues with a rash that was puritic, and has been getting Benadryl with the infusion.  I then had him switch to linezolid for 4 weeks and he is still taking that.  No new issues, his thumb is closed and healed and back to work.  No complaints.  No myalgias.  No peripheral neuropathy.       Review of Systems  Constitutional: Negative for fatigue.  Musculoskeletal: Negative for myalgias.  Psychiatric/Behavioral: Negative for sleep disturbance.       Objective:   Physical Exam  Constitutional: He appears well-developed and well-nourished. No distress.  Cardiovascular: Normal rate, regular rhythm and normal heart sounds.  No murmur heard. Pulmonary/Chest: No respiratory distress.  Lymphadenopathy:    He has no cervical adenopathy.  Skin: Rash noted.  Small areas of dried papules and improved.      SH: no tobacco      Assessment & Plan:

## 2018-04-14 NOTE — Assessment & Plan Note (Signed)
Will check cbc, cmp ck today rtc prn unless concerns.

## 2018-04-15 LAB — COMPLETE METABOLIC PANEL WITH GFR
AG Ratio: 1.7 (calc) (ref 1.0–2.5)
ALBUMIN MSPROF: 4.8 g/dL (ref 3.6–5.1)
ALKALINE PHOSPHATASE (APISO): 74 U/L (ref 40–115)
ALT: 13 U/L (ref 9–46)
AST: 19 U/L (ref 10–35)
BILIRUBIN TOTAL: 0.9 mg/dL (ref 0.2–1.2)
BUN / CREAT RATIO: 8 (calc) (ref 6–22)
BUN: 15 mg/dL (ref 7–25)
CO2: 23 mmol/L (ref 20–32)
CREATININE: 1.77 mg/dL — AB (ref 0.70–1.33)
Calcium: 10 mg/dL (ref 8.6–10.3)
Chloride: 103 mmol/L (ref 98–110)
GFR, EST AFRICAN AMERICAN: 49 mL/min/{1.73_m2} — AB (ref >=60)
GFR, Est Non African American: 43 mL/min/{1.73_m2} — ABNORMAL LOW (ref >=60)
GLOBULIN: 2.8 g/dL (ref 1.9–3.7)
Glucose, Bld: 163 mg/dL — ABNORMAL HIGH (ref 65–99)
Potassium: 3.8 mmol/L (ref 3.5–5.3)
SODIUM: 140 mmol/L (ref 135–146)
TOTAL PROTEIN: 7.6 g/dL (ref 6.1–8.1)

## 2018-04-15 LAB — CBC WITH DIFFERENTIAL/PLATELET
BASOS ABS: 70 {cells}/uL (ref 0–200)
Basophils Relative: 1.1 %
EOS ABS: 90 {cells}/uL (ref 15–500)
Eosinophils Relative: 1.4 %
HCT: 48.4 % (ref 38.5–50.0)
HEMOGLOBIN: 16.4 g/dL (ref 13.2–17.1)
Lymphs Abs: 1370 cells/uL (ref 850–3900)
MCH: 29.1 pg (ref 27.0–33.0)
MCHC: 33.9 g/dL (ref 32.0–36.0)
MCV: 86 fL (ref 80.0–100.0)
MONOS PCT: 5.2 %
MPV: 10.6 fL (ref 7.5–12.5)
NEUTROS PCT: 70.9 %
Neutro Abs: 4538 cells/uL (ref 1500–7800)
Platelets: 295 10*3/uL (ref 140–400)
RBC: 5.63 10*6/uL (ref 4.20–5.80)
RDW: 13.6 % (ref 11.0–15.0)
TOTAL LYMPHOCYTE: 21.4 %
WBC mixed population: 333 cells/uL (ref 200–950)
WBC: 6.4 10*3/uL (ref 3.8–10.8)

## 2018-04-15 LAB — CK: CK TOTAL: 465 U/L — AB (ref 44–196)

## 2018-06-25 ENCOUNTER — Ambulatory Visit (INDEPENDENT_AMBULATORY_CARE_PROVIDER_SITE_OTHER): Payer: Medicaid Other | Admitting: Physician Assistant

## 2018-08-22 IMAGING — DX DG CHEST 2V
2 series · 2 of 2 positions shown · non-contrast
Comparison: 08/10/2006

CLINICAL DATA: Cough productive of yellow phlegm and congestion.
Intermittent chills for 1 week.

EXAM:
CHEST  2 VIEW

[chest pa]
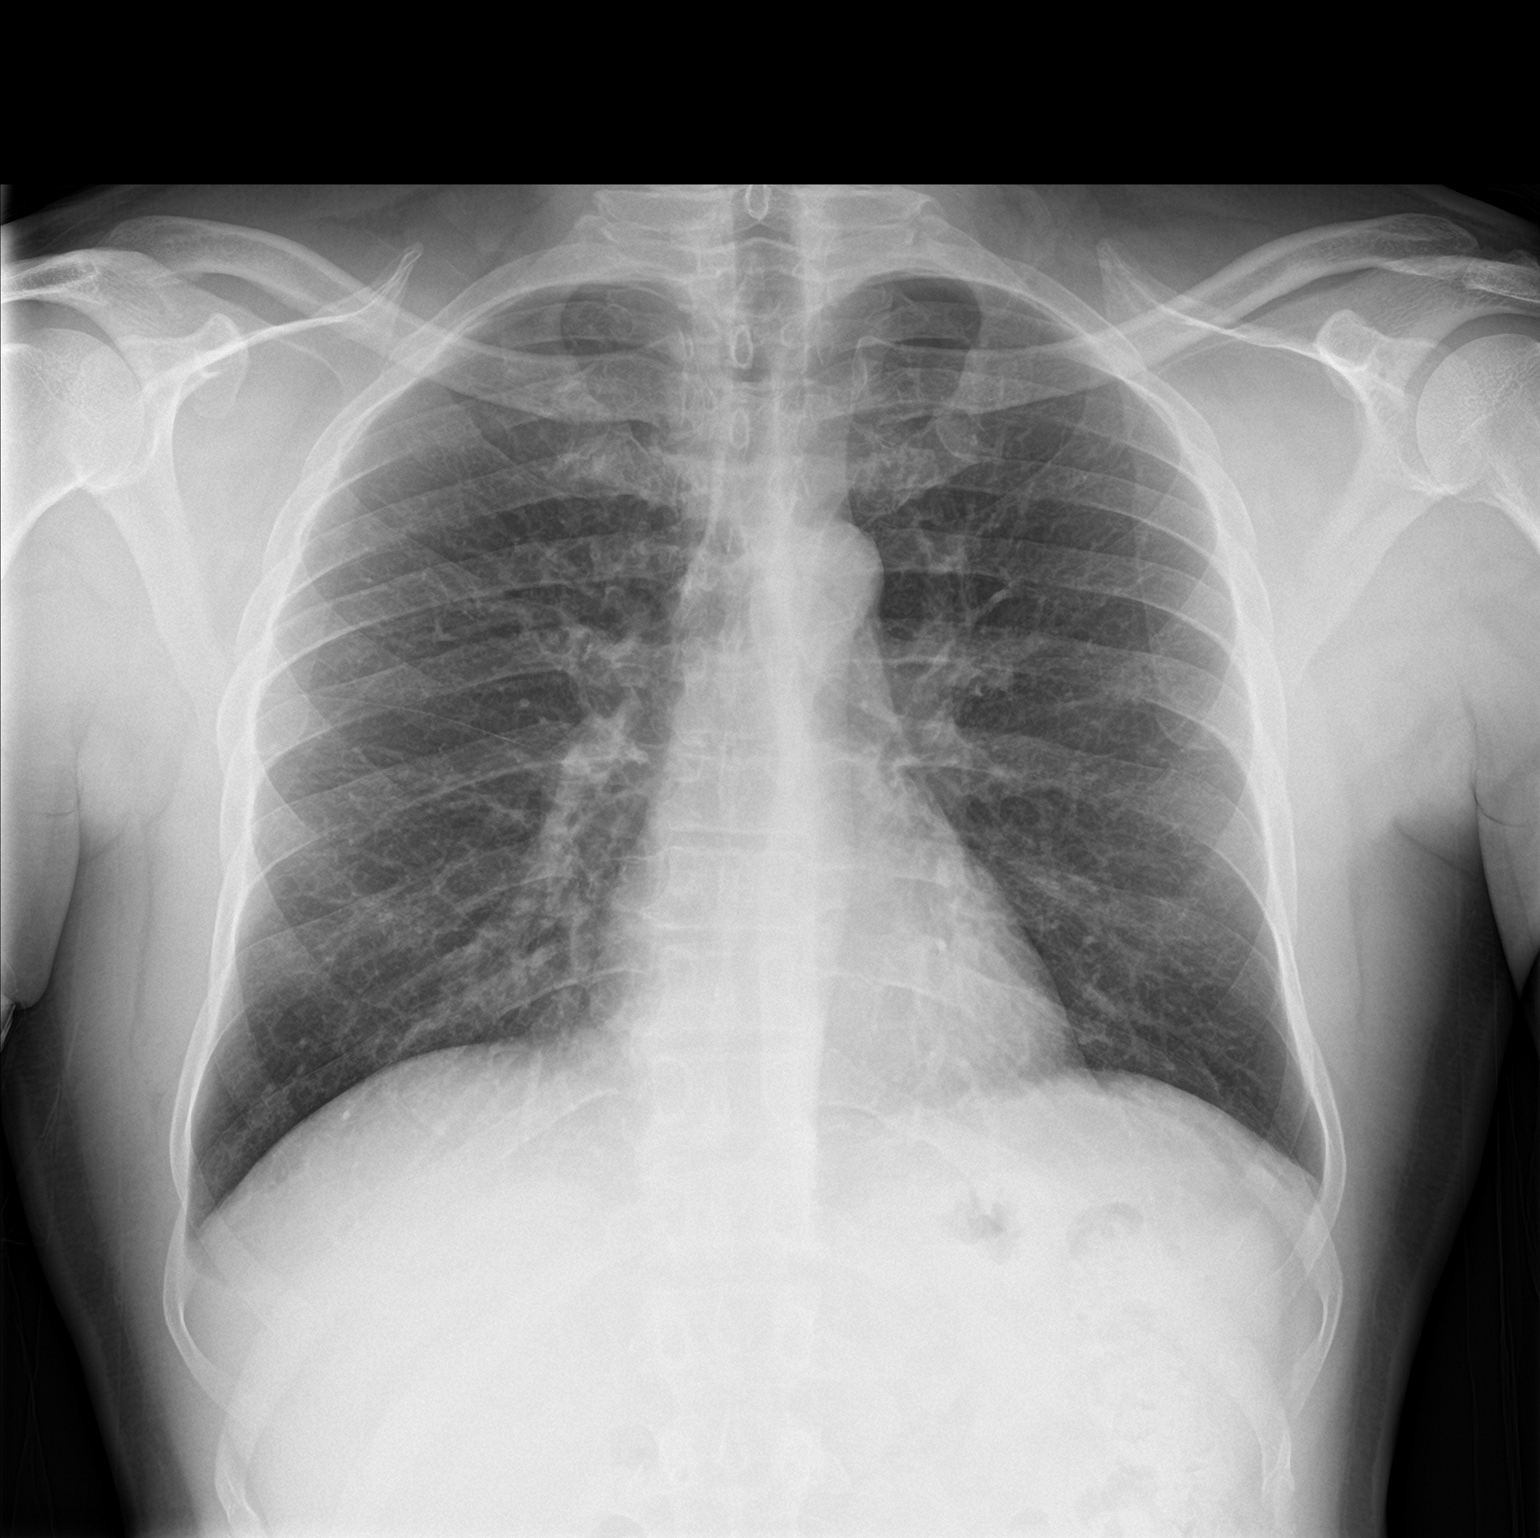

[chest lat]
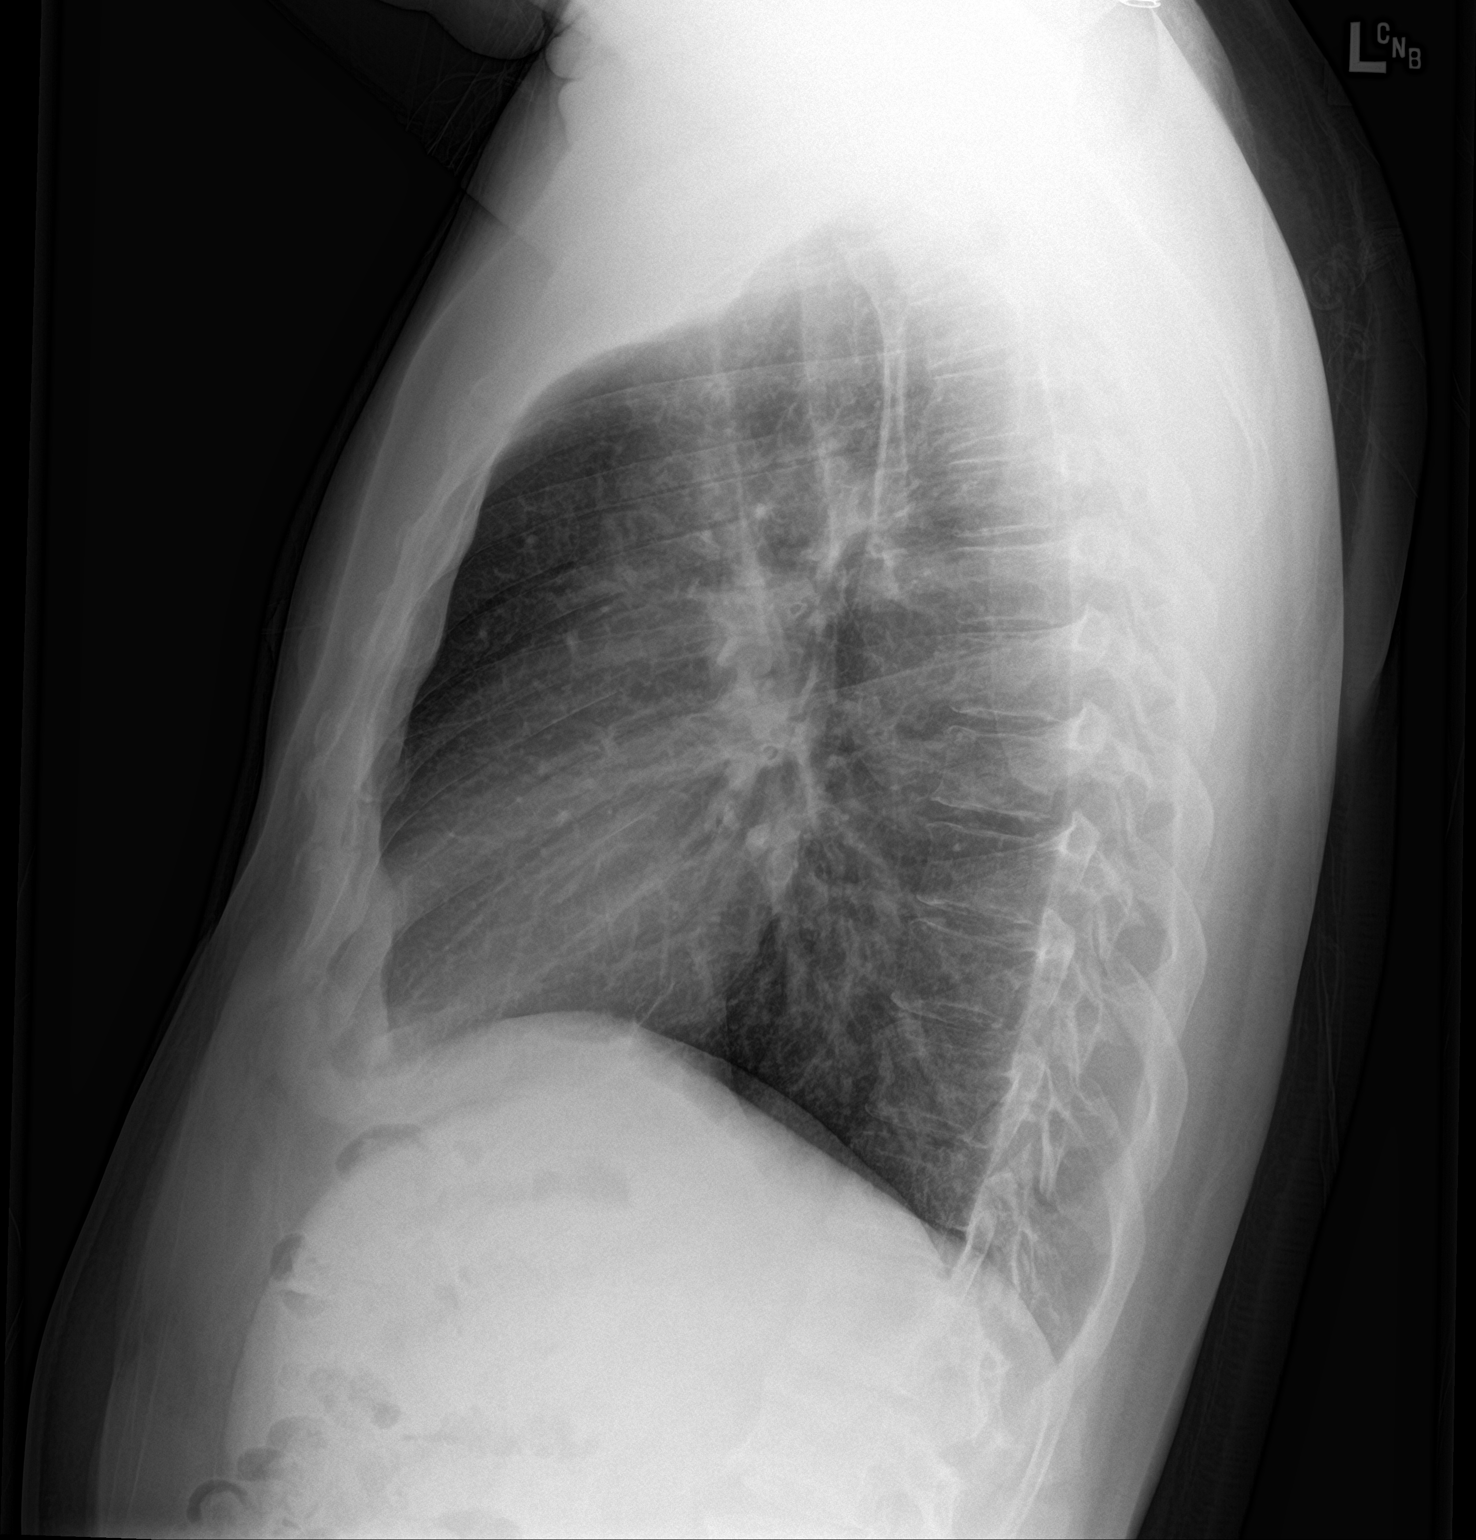

[2 of 2 positions shown; findings below may reference images not displayed]

FINDINGS: The heart size and mediastinal contours are within normal limits.
Both lungs are clear. The visualized skeletal structures are
unremarkable.
IMPRESSION: Normal chest

## 2019-03-02 ENCOUNTER — Other Ambulatory Visit: Payer: Self-pay

## 2019-03-02 ENCOUNTER — Ambulatory Visit (INDEPENDENT_AMBULATORY_CARE_PROVIDER_SITE_OTHER): Payer: Self-pay | Admitting: Primary Care

## 2019-03-02 ENCOUNTER — Encounter (INDEPENDENT_AMBULATORY_CARE_PROVIDER_SITE_OTHER): Payer: Self-pay | Admitting: Primary Care

## 2019-03-02 VITALS — BP 173/94 | HR 59 | Temp 97.1°F | Ht 69.0 in | Wt 183.6 lb

## 2019-03-02 DIAGNOSIS — M109 Gout, unspecified: Secondary | ICD-10-CM

## 2019-03-02 DIAGNOSIS — R030 Elevated blood-pressure reading, without diagnosis of hypertension: Secondary | ICD-10-CM

## 2019-03-02 DIAGNOSIS — Z1211 Encounter for screening for malignant neoplasm of colon: Secondary | ICD-10-CM

## 2019-03-02 MED ORDER — COLCHICINE 0.6 MG PO TABS: 0.6000 mg | ORAL_TABLET | Freq: Two times a day (BID) | ORAL | 0 refills | Status: DC

## 2019-03-02 MED FILL — !COLCRYS 0.6 MG TABLET: 0.6 MG | 15 days supply | Qty: 30 | Fill #0

## 2019-03-02 NOTE — Patient Instructions (Addendum)
DASH Eating Plan DASH stands for "Dietary Approaches to Stop Hypertension." The DASH eating plan is a healthy eating plan that has been shown to reduce high blood pressure (hypertension). It may also reduce your risk for type 2 diabetes, heart disease, and stroke. The DASH eating plan may also help with weight loss. What are tips for following this plan?  General guidelines  Avoid eating more than 2,300 mg (milligrams) of salt (sodium) a day. If you have hypertension, you may need to reduce your sodium intake to 1,500 mg a day.  Limit alcohol intake to no more than 1 drink a day for nonpregnant women and 2 drinks a day for men. One drink equals 12 oz of beer, 5 oz of wine, or 1 oz of hard liquor.  Work with your health care provider to maintain a healthy body weight or to lose weight. Ask what an ideal weight is for you.  Get at least 30 minutes of exercise that causes your heart to beat faster (aerobic exercise) most days of the week. Activities may include walking, swimming, or biking.  Work with your health care provider or diet and nutrition specialist (dietitian) to adjust your eating plan to your individual calorie needs. Reading food labels   Check food labels for the amount of sodium per serving. Choose foods with less than 5 percent of the Daily Value of sodium. Generally, foods with less than 300 mg of sodium per serving fit into this eating plan.  To find whole grains, look for the word "whole" as the first word in the ingredient list. Shopping  Buy products labeled as "low-sodium" or "no salt added."  Buy fresh foods. Avoid canned foods and premade or frozen meals. Cooking  Avoid adding salt when cooking. Use salt-free seasonings or herbs instead of table salt or sea salt. Check with your health care provider or pharmacist before using salt substitutes.  Do not fry foods. Cook foods using healthy methods such as baking, boiling, grilling, and broiling instead.  Cook with  heart-healthy oils, such as olive, canola, soybean, or sunflower oil. Meal planning  Eat a balanced diet that includes: ? 5 or more servings of fruits and vegetables each day. At each meal, try to fill half of your plate with fruits and vegetables. ? Up to 6-8 servings of whole grains each day. ? Less than 6 oz of lean meat, poultry, or fish each day. A 3-oz serving of meat is about the same size as a deck of cards. One egg equals 1 oz. ? 2 servings of low-fat dairy each day. ? A serving of nuts, seeds, or beans 5 times each week. ? Heart-healthy fats. Healthy fats called Omega-3 fatty acids are found in foods such as flaxseeds and coldwater fish, like sardines, salmon, and mackerel.  Limit how much you eat of the following: ? Canned or prepackaged foods. ? Food that is high in trans fat, such as fried foods. ? Food that is high in saturated fat, such as fatty meat. ? Sweets, desserts, sugary drinks, and other foods with added sugar. ? Full-fat dairy products.  Do not salt foods before eating.  Try to eat at least 2 vegetarian meals each week.  Eat more home-cooked food and less restaurant, buffet, and fast food.  When eating at a restaurant, ask that your food be prepared with less salt or no salt, if possible. What foods are recommended? The items listed may not be a complete list. Talk with your dietitian about   what dietary choices are best for you. Grains Whole-grain or whole-wheat bread. Whole-grain or whole-wheat pasta. Brown rice. Oatmeal. Quinoa. Bulgur. Whole-grain and low-sodium cereals. Pita bread. Low-fat, low-sodium crackers. Whole-wheat flour tortillas. Vegetables Fresh or frozen vegetables (raw, steamed, roasted, or grilled). Low-sodium or reduced-sodium tomato and vegetable juice. Low-sodium or reduced-sodium tomato sauce and tomato paste. Low-sodium or reduced-sodium canned vegetables. Fruits All fresh, dried, or frozen fruit. Canned fruit in natural juice (without  added sugar). Meat and other protein foods Skinless chicken or turkey. Ground chicken or turkey. Pork with fat trimmed off. Fish and seafood. Egg whites. Dried beans, peas, or lentils. Unsalted nuts, nut butters, and seeds. Unsalted canned beans. Lean cuts of beef with fat trimmed off. Low-sodium, lean deli meat. Dairy Low-fat (1%) or fat-free (skim) milk. Fat-free, low-fat, or reduced-fat cheeses. Nonfat, low-sodium ricotta or cottage cheese. Low-fat or nonfat yogurt. Low-fat, low-sodium cheese. Fats and oils Soft margarine without trans fats. Vegetable oil. Low-fat, reduced-fat, or light mayonnaise and salad dressings (reduced-sodium). Canola, safflower, olive, soybean, and sunflower oils. Avocado. Seasoning and other foods Herbs. Spices. Seasoning mixes without salt. Unsalted popcorn and pretzels. Fat-free sweets. What foods are not recommended? The items listed may not be a complete list. Talk with your dietitian about what dietary choices are best for you. Grains Baked goods made with fat, such as croissants, muffins, or some breads. Dry pasta or rice meal packs. Vegetables Creamed or fried vegetables. Vegetables in a cheese sauce. Regular canned vegetables (not low-sodium or reduced-sodium). Regular canned tomato sauce and paste (not low-sodium or reduced-sodium). Regular tomato and vegetable juice (not low-sodium or reduced-sodium). Pickles. Olives. Fruits Canned fruit in a light or heavy syrup. Fried fruit. Fruit in cream or butter sauce. Meat and other protein foods Fatty cuts of meat. Ribs. Fried meat. Bacon. Sausage. Bologna and other processed lunch meats. Salami. Fatback. Hotdogs. Bratwurst. Salted nuts and seeds. Canned beans with added salt. Canned or smoked fish. Whole eggs or egg yolks. Chicken or turkey with skin. Dairy Whole or 2% milk, cream, and half-and-half. Whole or full-fat cream cheese. Whole-fat or sweetened yogurt. Full-fat cheese. Nondairy creamers. Whipped toppings.  Processed cheese and cheese spreads. Fats and oils Butter. Stick margarine. Lard. Shortening. Ghee. Bacon fat. Tropical oils, such as coconut, palm kernel, or palm oil. Seasoning and other foods Salted popcorn and pretzels. Onion salt, garlic salt, seasoned salt, table salt, and sea salt. Worcestershire sauce. Tartar sauce. Barbecue sauce. Teriyaki sauce. Soy sauce, including reduced-sodium. Steak sauce. Canned and packaged gravies. Fish sauce. Oyster sauce. Cocktail sauce. Horseradish that you find on the shelf. Ketchup. Mustard. Meat flavorings and tenderizers. Bouillon cubes. Hot sauce and Tabasco sauce. Premade or packaged marinades. Premade or packaged taco seasonings. Relishes. Regular salad dressings. Where to find more information:  National Heart, Lung, and Blood Institute: www.nhlbi.nih.gov  American Heart Association: www.heart.org Summary  The DASH eating plan is a healthy eating plan that has been shown to reduce high blood pressure (hypertension). It may also reduce your risk for type 2 diabetes, heart disease, and stroke.  With the DASH eating plan, you should limit salt (sodium) intake to 2,300 mg a day. If you have hypertension, you may need to reduce your sodium intake to 1,500 mg a day.  When on the DASH eating plan, aim to eat more fresh fruits and vegetables, whole grains, lean proteins, low-fat dairy, and heart-healthy fats.  Work with your health care provider or diet and nutrition specialist (dietitian) to adjust your eating plan to your   individual calorie needs. This information is not intended to replace advice given to you by your health care provider. Make sure you discuss any questions you have with your health care provider. Document Released: 08/02/2011 Document Revised: 07/26/2017 Document Reviewed: 08/06/2016 Elsevier Patient Education  2020 [employer]. Gout  Gout is painful swelling of your joints. Gout is a type of arthritis. It is caused by having too  much uric acid in your body. Uric acid is a chemical that is made when your body breaks down substances called purines. If your body has too much uric acid, sharp crystals can form and build up in your joints. This causes pain and swelling. Gout attacks can happen quickly and be very painful (acute gout). Over time, the attacks can affect more joints and happen more often (chronic gout). What are the causes?  Too much uric acid in your blood. This can happen because: ? Your kidneys do not remove enough uric acid from your blood. ? Your body makes too much uric acid. ? You eat too many foods that are high in purines. These foods include organ meats, some seafood, and beer.  Trauma or stress. What increases the risk?  Having a family history of gout.  Being male and middle-aged.  Being male and having gone through menopause.  Being very overweight (obese).  Drinking alcohol, especially beer.  Not having enough water in the body (being dehydrated).  Losing weight too quickly.  Having an organ transplant.  Having lead poisoning.  Taking certain medicines.  Having kidney disease.  Having a skin condition called psoriasis. What are the signs or symptoms? An attack of acute gout usually happens in just one joint. The most common place is the big toe. Attacks often start at night. Other joints that may be affected include joints of the feet, ankle, knee, fingers, wrist, or elbow. Symptoms of an attack may include:  Very bad pain.  Warmth.  Swelling.  Stiffness.  Shiny, red, or purple skin.  Tenderness. The affected joint may be very painful to touch.  Chills and fever. Chronic gout may cause symptoms more often. More joints may be involved. You may also have white or yellow lumps (tophi) on your hands or feet or in other areas near your joints. How is this treated?  Treatment for this condition has two phases: treating an acute attack and preventing future attacks.   Acute gout treatment may include: ? NSAIDs. ? Steroids. These are taken by mouth or injected into a joint. ? Colchicine. This medicine relieves pain and swelling. It can be given by mouth or through an IV tube.  Preventive treatment may include: ? Taking small doses of NSAIDs or colchicine daily. ? Using a medicine that reduces uric acid levels in your blood. ? Making changes to your diet. You may need to see a food expert (dietitian) about what to eat and drink to prevent gout. Follow these instructions at home: During a gout attack   If told, put ice on the painful area: ? Put ice in a plastic bag. ? Place a towel between your skin and the bag. ? Leave the ice on for 20 minutes, 2-3 times a day.  Raise (elevate) the painful joint above the level of your heart as often as you can.  Rest the joint as much as possible. If the joint is in your leg, you may be given crutches.  Follow instructions from your doctor about what you cannot eat or drink. Avoiding future  gout attacks  Eat a low-purine diet. Avoid foods and drinks such as: ? Liver. ? Kidney. ? Anchovies. ? Asparagus. ? Herring. ? Mushrooms. ? Mussels. ? Beer.  Stay at a healthy weight. If you want to lose weight, talk with your doctor. Do not lose weight too fast.  Start or continue an exercise plan as told by your doctor. Eating and drinking  Drink enough fluids to keep your pee (urine) pale yellow.  If you drink alcohol: ? Limit how much you use to:  0-1 drink a day for women.  0-2 drinks a day for men. ? Be aware of how much alcohol is in your drink. In the [place] one drink equals one 12 oz bottle of beer (355 mL), one 5 oz glass of wine (148 mL), or one 1 oz glass of hard liquor (44 mL). General instructions  Take over-the-counter and prescription medicines only as told by your doctor.  Do not drive or use heavy machinery while taking prescription pain medicine.  Return to your normal activities as  told by your doctor. Ask your doctor what activities are safe for you.  Keep all follow-up visits as told by your doctor. This is important. Contact a doctor if:  You have another gout attack.  You still have symptoms of a gout attack after 10 days of treatment.  You have problems (side effects) because of your medicines.  You have chills or a fever.  You have burning pain when you pee (urinate).  You have pain in your lower back or belly. Get help right away if:  You have very bad pain.  Your pain cannot be controlled.  You cannot pee. Summary  Gout is painful swelling of the joints.  The most common site of pain is the big toe, but it can affect other joints.  Medicines and avoiding some foods can help to prevent and treat gout attacks. This information is not intended to replace advice given to you by your health care provider. Make sure you discuss any questions you have with your health care provider. Document Released: 05/22/2008 Document Revised: 03/05/2018 Document Reviewed: 03/05/2018 Elsevier Patient Education  2020 [employer].

## 2019-03-02 NOTE — Progress Notes (Signed)
Established Patient Office Visit  Subjective:  Patient ID: Brian Erickson, male    DOB: July 09, 1966  Age: 53 y.o. MRN: [id number]  CC:  Chief Complaint  Patient presents with  . Gout    unable to put on shoe    HPI Brian Erickson presents for acute gout attack after we were discussing the foods he has not been eating he remembere his niece visited and make fried lobster/crab strips he was not aware what was in it until the next day his big toe was swollen and painful. Unable to wear a shoe due to swelling and pain.  Past Medical History:  Diagnosis Date  . Felon of finger of left hand 01/26/2018  . Finger osteomyelitis, left ([place]) 02/12/2018  . GERD (gastroesophageal reflux disease)   . Gout   . Infection of thumb    left +MRSA  . MRSA (methicillin resistant staph aureus) culture positive    current infection left thumb    Past Surgical History:  Procedure Laterality Date  . I&D EXTREMITY Left 01/26/2018   Procedure: IRRIGATION AND DEBRIDEMENT LEFT THUMB;  Surgeon: Marchia Bond, MD;  Location: [facility];  Service: Orthopedics;  Laterality: Left;  . I&D EXTREMITY Left 02/12/2018   Procedure: IRRIGATION AND DEBRIDEMENT OF LEFT THUMB;  Surgeon: Marchia Bond, MD;  Location: [facility];  Service: Orthopedics;  Laterality: Left;    History reviewed. No pertinent family history.  Social History   Socioeconomic History  . Marital status: Single    Spouse name: Not on file  . Number of children: Not on file  . Years of education: Not on file  . Highest education level: Not on file  Occupational History  . Not on file  Social Needs  . Financial resource strain: Not on file  . Food insecurity    Worry: Not on file    Inability: Not on file  . Transportation needs    Medical: Not on file    Non-medical: Not on file  Tobacco Use  . Smoking status: Never Smoker  . Smokeless tobacco: Never Used  Substance and Sexual Activity  . Alcohol use: No    Comment:  socially   . Drug use: No  . Sexual activity: Not on file  Lifestyle  . Physical activity    Days per week: Not on file    Minutes per session: Not on file  . Stress: Not on file  Relationships  . Social Herbalist on phone: Not on file    Gets together: Not on file    Attends religious service: Not on file    Active member of club or organization: Not on file    Attends meetings of clubs or organizations: Not on file    Relationship status: Not on file  . Intimate partner violence    Fear of current or ex partner: Not on file    Emotionally abused: Not on file    Physically abused: Not on file    Forced sexual activity: Not on file  Other Topics Concern  . Not on file  Social History Narrative  . Not on file    Outpatient Medications Prior to Visit  Medication Sig Dispense Refill  . acetaminophen (TYLENOL) 500 MG tablet Take 500 mg by mouth every 6 (six) hours as needed.    Marland Kitchen allopurinol (ZYLOPRIM) 300 MG tablet Take 1 tablet (300 mg total) by mouth daily. (Patient not taking: Reported on 03/02/2019) 30  tablet 6  . HYDROcodone-acetaminophen (NORCO) 5-325 MG tablet Take 1-2 tablets by mouth every 6 (six) hours as needed for moderate pain. MAXIMUM TOTAL ACETAMINOPHEN DOSE IS 4000 MG PER DAY 30 tablet 0  . ibuprofen (ADVIL,MOTRIN) 600 MG tablet Take 1 tablet (600 mg total) by mouth every 6 (six) hours as needed. 30 tablet 0   No facility-administered medications prior to visit.     Allergies  Allergen Reactions  . Daptomycin     Rhabdomyolysis     ROS Review of Systems  Skin: Positive for color change.       Swollen big left foot  All other systems reviewed and are negative.     Objective:    Physical Exam  Constitutional: He is oriented to person, place, and time. He appears well-developed and well-nourished.  Eyes: EOM are normal.  Neck: Normal range of motion. Neck supple.  Abdominal: Bowel sounds are normal. He exhibits distension.  Musculoskeletal:         General: Edema present.     Comments: Unstable gait due to painful left toe  Neurological: He is oriented to person, place, and time.  Skin: Skin is warm and dry.  Psychiatric: He has a normal mood and affect.    BP (!) 181/95 (BP Location: Left Arm, Patient Position: Sitting, Cuff Size: Normal)   Pulse 64   Temp (!) 97.1 F (36.2 C) (Oral)   Ht _0  (1.753 m)   Wt 183 lb 9.6 oz (83.3 kg)   SpO2 94%   BMI 27.11 kg/m  Wt Readings from Last 3 Encounters:  03/02/19 183 lb 9.6 oz (83.3 kg)  04/14/18 170 lb 1.9 oz (77.2 kg)  03/04/18 178 lb (80.7 kg)     Health Maintenance Due  Topic Date Due  . COLONOSCOPY  08/29/2013    There are no preventive care reminders to display for this patient.  No results found for: TSH Lab Results  Component Value Date   WBC 6.4 04/14/2018   HGB 16.4 04/14/2018   HCT 48.4 04/14/2018   MCV 86.0 04/14/2018   PLT 295 04/14/2018   Lab Results  Component Value Date   NA 140 04/14/2018   K 3.8 04/14/2018   CO2 23 04/14/2018   GLUCOSE 163 (H) 04/14/2018   BUN 15 04/14/2018   CREATININE 1.77 (H) 04/14/2018   BILITOT 0.9 04/14/2018   ALKPHOS 67 11/16/2017   AST 19 04/14/2018   ALT 13 04/14/2018   PROT 7.6 04/14/2018   ALBUMIN 3.9 11/16/2017   CALCIUM 10.0 04/14/2018   ANIONGAP 7 03/01/2018   No results found for: CHOL No results found for: HDL No results found for: LDLCALC No results found for: TRIG No results found for: CHOLHDL No results found for: HGBA1C    Assessment & Plan:   Problem List Items Addressed This Visit    None    Visit Diagnoses    Special screening for malignant neoplasms, colon    -  Primary   Relevant Orders   Fecal occult blood, imunochemical([facility])   Acute gout of right foot, unspecified cause       Relevant Medications   colchicine 0.6 MG tablet   Other Relevant Orders   Uric Acid   Elevated blood pressure reading in office with white coat syndrome, without diagnosis of  hypertension       Relevant Orders   CMP14+EGFR   CBC with Differential   Colon cancer screening  Meds ordered this encounter  Medications  . colchicine 0.6 MG tablet    Sig: Take 1 tablet (0.6 mg total) by mouth 2 (two) times daily.    Dispense:  30 tablet    Refill:  0  Brian Erickson was seen today for gout.  Diagnoses and all orders for this visit:  Special screening for malignant neoplasms, colon -     Fecal occult blood, imunochemical([facility]); Future  Acute gout of right foot, unspecified cause -     Uric Acid  Elevated blood pressure reading in office with white coat syndrome, without diagnosis of hypertension Counseled on blood pressure goal of less than 130/80, low-sodium, DASH diet, medication compliance, 150 minutes of moderate intensity exercise per week. Discussed medication compliance, adverse effects. -     CMP14+EGFR -     CBC with Differential  Colon cancer screening Normal colon cancer screening.  CDC recommends colorectal screening from ages 90-75. Screening can begin at 60 or earlier in some cases. This screening is used for a disease when no symptoms are present . Diagnostic test is used for symptoms examples blood in stool, colorectal polyps or coloector cancer, family history or inflammatory bowel disease - chron's or ulcerative colitis .(USPSTF)  Other orders -     colchicine 0.6 MG tablet; Take 1 tablet (0.6 mg total) by mouth 2 (two) times daily.    Follow-up: Return in about 6 weeks (around 04/13/2019) for Bp check.    Kerin Perna, NP

## 2019-03-03 LAB — CMP14+EGFR
ALT: 9 IU/L (ref 0–44)
AST: 14 IU/L (ref 0–40)
Albumin/Globulin Ratio: 1.4 (ref 1.2–2.2)
Albumin: 4.2 g/dL (ref 3.8–4.9)
Alkaline Phosphatase: 116 IU/L (ref 39–117)
BUN/Creatinine Ratio: 7 — ABNORMAL LOW (ref 9–20)
BUN: 8 mg/dL (ref 6–24)
Bilirubin Total: 0.3 mg/dL (ref 0.0–1.2)
CO2: 17 mmol/L — ABNORMAL LOW (ref 20–29)
Calcium: 9.6 mg/dL (ref 8.7–10.2)
Chloride: 107 mmol/L — ABNORMAL HIGH (ref 96–106)
Creatinine, Ser: 1.2 mg/dL (ref 0.76–1.27)
GFR calc Af Amer: 78 mL/min/{1.73_m2} (ref >59)
GFR calc non Af Amer: 68 mL/min/{1.73_m2} (ref >59)
Globulin, Total: 3 g/dL (ref 1.5–4.5)
Glucose: 104 mg/dL — ABNORMAL HIGH (ref 65–99)
Potassium: 4.1 mmol/L (ref 3.5–5.2)
Sodium: 142 mmol/L (ref 134–144)
Total Protein: 7.2 g/dL (ref 6.0–8.5)

## 2019-03-03 LAB — CBC WITH DIFFERENTIAL/PLATELET
Basophils Absolute: 0.1 10*3/uL (ref 0.0–0.2)
Basos: 1 %
EOS (ABSOLUTE): 0.2 10*3/uL (ref 0.0–0.4)
Eos: 3 %
Hematocrit: 46 % (ref 37.5–51.0)
Hemoglobin: 15.4 g/dL (ref 13.0–17.7)
Immature Grans (Abs): 0 10*3/uL (ref 0.0–0.1)
Immature Granulocytes: 1 %
Lymphocytes Absolute: 2 10*3/uL (ref 0.7–3.1)
Lymphs: 35 %
MCH: 29.1 pg (ref 26.6–33.0)
MCHC: 33.5 g/dL (ref 31.5–35.7)
MCV: 87 fL (ref 79–97)
Monocytes Absolute: 0.4 10*3/uL (ref 0.1–0.9)
Monocytes: 6 %
Neutrophils Absolute: 3.1 10*3/uL (ref 1.4–7.0)
Neutrophils: 54 %
Platelets: 321 10*3/uL (ref 150–450)
RBC: 5.3 x10E6/uL (ref 4.14–5.80)
RDW: 13.5 % (ref 11.6–15.4)
WBC: 5.9 10*3/uL (ref 3.4–10.8)

## 2019-03-03 LAB — URIC ACID: Uric Acid: 8.8 mg/dL — ABNORMAL HIGH (ref 3.7–8.6)

## 2019-03-06 ENCOUNTER — Telehealth (INDEPENDENT_AMBULATORY_CARE_PROVIDER_SITE_OTHER): Payer: Self-pay

## 2019-03-06 NOTE — Telephone Encounter (Signed)
-----   Message from Kerin Perna, NP sent at 03/03/2019  4:29 PM EDT ----- Uric acid slightly elevated tx(treated) with colchicine

## 2019-03-06 NOTE — Telephone Encounter (Signed)
Patient verified date of birth and was then informed that uric acid is slightly elevated; he was already prescribed colchicine during OV. Take as directed. Nat Christen, CMA

## 2019-04-13 ENCOUNTER — Other Ambulatory Visit: Payer: Self-pay

## 2019-04-13 ENCOUNTER — Ambulatory Visit (INDEPENDENT_AMBULATORY_CARE_PROVIDER_SITE_OTHER): Payer: Self-pay | Admitting: Primary Care

## 2019-04-13 ENCOUNTER — Encounter (INDEPENDENT_AMBULATORY_CARE_PROVIDER_SITE_OTHER): Payer: Self-pay | Admitting: Primary Care

## 2019-04-13 VITALS — BP 134/74 | HR 72 | Temp 98.1°F | Ht 69.0 in | Wt 190.0 lb

## 2019-04-13 DIAGNOSIS — R030 Elevated blood-pressure reading, without diagnosis of hypertension: Secondary | ICD-10-CM

## 2019-04-13 DIAGNOSIS — Z760 Encounter for issue of repeat prescription: Secondary | ICD-10-CM

## 2019-04-13 DIAGNOSIS — M109 Gout, unspecified: Secondary | ICD-10-CM

## 2019-04-13 MED ORDER — ALLOPURINOL 300 MG PO TABS: 300.0000 mg | ORAL_TABLET | Freq: Every day | ORAL | 3 refills | Status: DC

## 2019-04-13 MED FILL — ALLOPURINOL 300 MG TAB: 300 | 30 days supply | Qty: 30 | Fill #0

## 2019-04-13 NOTE — Progress Notes (Signed)
                   Patient is here for BP check    Med refills

## 2019-04-13 NOTE — Progress Notes (Signed)
Established Patient Office Visit  Subjective:  Patient ID: Brian Erickson, male    DOB: 03/07/1966  Age: 53 y.o. MRN: [id number]  CC:  Chief Complaint  Patient presents with  . Blood Pressure Check  . Medication Refill    HPI Brian Erickson presents for blood pressure follow up -he denies shortness of breath, headaches, chest pain or lower extremity edema. Vast improvement on blood pressure previously 174/94 today was a recheck to determine diagnosis of hypertension. 134/74. Requesting medication refills.   Past Medical History:  Diagnosis Date  . Felon of finger of left hand 01/26/2018  . 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 GERD (gastroesophageal reflux disease)   . Gout   . Infection of thumb    left +MRSA  . MRSA (methicillin resistant staph aureus) culture positive    current infection left thumb    Past Surgical History:  Procedure Laterality Date  . I&D EXTREMITY Left 01/26/2018   Procedure: IRRIGATION AND DEBRIDEMENT LEFT THUMB;  Surgeon: Teryl LucyLandau, Joshua, MD;  Location: [facility];  Service: Orthopedics;  Laterality: Left;  . I&D EXTREMITY Left 02/12/2018   Procedure: IRRIGATION AND DEBRIDEMENT OF LEFT THUMB;  Surgeon: Teryl LucyLandau, Joshua, MD;  Location: [facility];  Service: Orthopedics;  Laterality: Left;    History reviewed. No pertinent family history.  Social History   Socioeconomic History  . Marital status: Single    Spouse name: Not on file  . Number of children: Not on file  . Years of education: Not on file  . Highest education level: Not on file  Occupational History  . Not on file  Social Needs  . Financial resource strain: Not on file  . Food insecurity    Worry: Not on file    Inability: Not on file  . Transportation needs    Medical: Not on file    Non-medical: Not on file  Tobacco Use  . Smoking status: Never Smoker  . Smokeless tobacco: Never Used  Substance and Sexual Activity  . Alcohol use: No    Comment: socially    . Drug use: No  . Sexual activity: Not on file  Lifestyle  . Physical activity    Days per week: Not on file    Minutes per session: Not on file  . Stress: Not on file  Relationships  . Social Musicianconnections    Talks on phone: Not on file    Gets together: Not on file    Attends religious service: Not on file    Active member of club or organization: Not on file    Attends meetings of clubs or organizations: Not on file    Relationship status: Not on file  . Intimate partner violence    Fear of current or ex partner: Not on file    Emotionally abused: Not on file    Physically abused: Not on file    Forced sexual activity: Not on file  Other Topics Concern  . Not on file  Social History Narrative  . Not on file    Outpatient Medications Prior to Visit  Medication Sig Dispense Refill  . allopurinol (ZYLOPRIM) 300 MG tablet Take 1 tablet (300 mg total) by mouth daily. 30 tablet 6  . acetaminophen (TYLENOL) 500 MG tablet Take 500 mg by mouth every 6 (six) hours as needed.    . colchicine 0.6 MG tablet Take 1 tablet (0.6 mg total) by mouth 2 (two) times daily. (Patient not taking: Reported  on 04/13/2019) 30 tablet 0   No facility-administered medications prior to visit.     Allergies  Allergen Reactions  . Daptomycin     Rhabdomyolysis     ROS Review of Systems  Musculoskeletal:       Gout flare up on the top of left foot uric acid levels was 8.8      Objective:    Physical Exam  Constitutional: He is oriented to person, place, and time. He appears well-developed and well-nourished.  HENT:  Head: Normocephalic.  Cardiovascular: Normal rate and regular rhythm.  Pulmonary/Chest: Effort normal.  Abdominal: Soft. Bowel sounds are normal. He exhibits distension.  Musculoskeletal: Normal range of motion.  Neurological: He is oriented to person, place, and time.  Psychiatric: He has a normal mood and affect.    BP 134/74 (BP Location: Left Arm, Patient Position:  Sitting, Cuff Size: Large)   Pulse 72   Temp 98.1 F (36.7 C) (Oral)   Ht 5\' 9"  (1.753 m)   Wt 190 lb (86.2 kg)   SpO2 96%   BMI 28.06 kg/m  Wt Readings from Last 3 Encounters:  04/13/19 190 lb (86.2 kg)  03/02/19 183 lb 9.6 oz (83.3 kg)  04/14/18 170 lb 1.9 oz (77.2 kg)     Health Maintenance Due  Topic Date Due  . COLONOSCOPY  08/29/2013  . INFLUENZA VACCINE  03/28/2019    There are no preventive care reminders to display for this patient.  No results found for: TSH Lab Results  Component Value Date   WBC 5.9 03/02/2019   HGB 15.4 03/02/2019   HCT 46.0 03/02/2019   MCV 87 03/02/2019   PLT 321 03/02/2019   Lab Results  Component Value Date   NA 142 03/02/2019   K 4.1 03/02/2019   CO2 17 (L) 03/02/2019   GLUCOSE 104 (H) 03/02/2019   BUN 8 03/02/2019   CREATININE 1.20 03/02/2019   BILITOT 0.3 03/02/2019   ALKPHOS 116 03/02/2019   AST 14 03/02/2019   ALT 9 03/02/2019   PROT 7.2 03/02/2019   ALBUMIN 4.2 03/02/2019   CALCIUM 9.6 03/02/2019   ANIONGAP 7 03/01/2018     Assessment & Plan:   Problem List Items Addressed This Visit    None    Visit Diagnoses    Elevated blood pressure reading in office with white coat syndrome, without diagnosis of hypertension    -  Primary   Medication refill       Gout, unspecified cause, unspecified chronicity, unspecified site       Acute gout of right foot, unspecified cause       Relevant Medications   allopurinol (ZYLOPRIM) 300 MG tablet    Brian Erickson was seen today for blood pressure check and medication refill.  Diagnoses and all orders for this visit:  Elevated blood pressure reading in office with white coat syndrome, without diagnosis of hypertension Guidelines for HTN.  Counseled DASH diet, 150 minutes of moderate intensity exercise per week.  Medication refill allopurinol (ZYLOPRIM) 300 MG tablet; Take 1 tablet (300 mg total) by mouth daily.  Gout, unspecified cause, unspecified chronicity, unspecified  site Gout is an inflammatory arthritis related to a hyperuricemia.  Risk factors greater than 40, male gender, increase in purines red meats and seafoods, alcohol smoking, and obesity greater than 30  Acute gout of right foot, unspecified cause Previous appointment he was having a gout attack and placed on colchicine -   -  Meds ordered this encounter  Medications  . allopurinol (ZYLOPRIM) 300 MG tablet    Sig: Take 1 tablet (300 mg total) by mouth daily.    Dispense:  30 tablet    Refill:  3    Follow-up: Return in about 3 months (around 07/14/2019) for Re check Bp and uric acid .    Kerin Perna, NP

## 2019-06-29 ENCOUNTER — Emergency Department (HOSPITAL_COMMUNITY)
Admission: EM | Admit: 2019-06-29 | Discharge: 2019-06-29 | Disposition: A | Discharge status: Home / Self Care | Payer: Medicaid Other | Attending: Emergency Medicine | Admitting: Emergency Medicine

## 2019-06-29 ENCOUNTER — Encounter (HOSPITAL_COMMUNITY): Payer: Self-pay | Admitting: Emergency Medicine

## 2019-06-29 ENCOUNTER — Other Ambulatory Visit: Payer: Self-pay

## 2019-06-29 DIAGNOSIS — M109 Gout, unspecified: Secondary | ICD-10-CM

## 2019-06-29 DIAGNOSIS — Z79899 Other long term (current) drug therapy: Secondary | ICD-10-CM | POA: Insufficient documentation

## 2019-06-29 MED ORDER — HYDROCODONE-ACETAMINOPHEN 5-325 MG PO TABS: 1.0000 | ORAL_TABLET | Freq: Four times a day (QID) | ORAL | 0 refills | Status: DC | PRN

## 2019-06-29 MED ORDER — ALLOPURINOL 300 MG PO TABS: 300.0000 mg | ORAL_TABLET | Freq: Every day | ORAL | 3 refills | Status: DC

## 2019-06-29 MED ORDER — PREDNISONE 20 MG PO TABS: 40.0000 mg | ORAL_TABLET | Freq: Every day | ORAL | 0 refills | Status: DC

## 2019-06-29 MED ORDER — COLCHICINE 0.6 MG PO TABS: 0.6000 mg | ORAL_TABLET | Freq: Two times a day (BID) | ORAL | 0 refills | Status: DC

## 2019-06-29 MED FILL — !COLCRYS 0.6 MG TABLET: 0.6 MG | 15 days supply | Qty: 30 | Fill #0

## 2019-06-29 MED FILL — predniSONE 20 MG TABS: 20 | 5 days supply | Qty: 10 | Fill #0

## 2019-06-29 MED FILL — ALLOPURINOL 300 MG TAB: 300 | 30 days supply | Qty: 30 | Fill #0

## 2019-06-29 NOTE — Discharge Instructions (Signed)
Please read attached information. If you experience any new or worsening signs or symptoms please return to the emergency room for evaluation. Please follow-up with your primary care provider or specialist as discussed. Please use medication prescribed only as directed and discontinue taking if you have any concerning signs or symptoms.   °

## 2019-06-29 NOTE — ED Notes (Signed)
Patient Alert and oriented to baseline. Stable and ambulatory to baseline. Patient verbalized understanding of the discharge instructions.  Patient belongings were taken by the patient.   

## 2019-06-29 NOTE — ED Triage Notes (Signed)
Right foot pain, states feels like gout- hx of same

## 2019-06-29 NOTE — ED Provider Notes (Signed)
[facility] EMERGENCY DEPARTMENT Provider Note   CSN: [id number] Arrival date & time: 06/29/19  0935     History   Chief Complaint Chief Complaint  Patient presents with  . Gout    HPI Brian Erickson is a 53 y.o. male.     HPI   53 year old male presents today with complaints of gout.  Patient notes 3 days ago he developed pain in his right dorsal foot.  He notes this is typical of his gout flare.  He notes he was unable to walk secondary to the extreme pain.  Patient denies any fever, denies any trauma to the foot.  Patient notes that usually he can take colchicine and symptoms resolved promptly but he has run out of both his colchicine and his allopurinol.  Patient notes the pain is slightly better today than yesterday and can finally put his shoe on.  He also notes trying Tylenol at home without symptomatic improvement.    Past Medical History:  Diagnosis Date  . Felon of finger of left hand 01/26/2018  . Finger osteomyelitis, left ([place]) 02/12/2018  . GERD (gastroesophageal reflux disease)   . Gout   . Infection of thumb    left +MRSA  . MRSA (methicillin resistant staph aureus) culture positive    current infection left thumb    Patient Active Problem List   Diagnosis Date Noted  . Medication monitoring encounter 02/20/2018  . Rash and nonspecific skin eruption 02/20/2018  . Finger osteomyelitis, left ([place]) 02/12/2018  . Felon of finger of left hand 01/26/2018  . Screening examination for STD (sexually transmitted disease) 12/24/2016    Past Surgical History:  Procedure Laterality Date  . I&D EXTREMITY Left 01/26/2018   Procedure: IRRIGATION AND DEBRIDEMENT LEFT THUMB;  Surgeon: Marchia Bond, MD;  Location: [facility];  Service: Orthopedics;  Laterality: Left;  . I&D EXTREMITY Left 02/12/2018   Procedure: IRRIGATION AND DEBRIDEMENT OF LEFT THUMB;  Surgeon: Marchia Bond, MD;  Location: [facility];  Service: Orthopedics;  Laterality:  Left;        Home Medications    Prior to Admission medications   Medication Sig Start Date End Date Taking? Authorizing Provider  acetaminophen (TYLENOL) 500 MG tablet Take 500 mg by mouth every 6 (six) hours as needed.    [provider]  allopurinol (ZYLOPRIM) 300 MG tablet Take 1 tablet (300 mg total) by mouth daily. 06/29/19   Aalliyah Kilker, Dellis Filbert, PA-C  colchicine 0.6 MG tablet Take 1 tablet (0.6 mg total) by mouth 2 (two) times daily. 06/29/19   Cayle Cordoba, Dellis Filbert, PA-C  HYDROcodone-acetaminophen (NORCO/VICODIN) 5-325 MG tablet Take 1 tablet by mouth every 6 (six) hours as needed. 06/29/19   Mikele Sifuentes, Dellis Filbert, PA-C  predniSONE (DELTASONE) 20 MG tablet Take 2 tablets (40 mg total) by mouth daily. 06/29/19   Okey Regal, PA-C    Family History No family history on file.  Social History Social History   Tobacco Use  . Smoking status: Never Smoker  . Smokeless tobacco: Never Used  Substance Use Topics  . Alcohol use: No    Comment: socially   . Drug use: No     Allergies   Daptomycin   Review of Systems Review of Systems  All other systems reviewed and are negative.    Physical Exam Updated Vital Signs BP (!) 142/90 (BP Location: Right Arm)   Pulse 60   Temp 98.3 F (36.8 C) (Oral)   Resp 16   Ht 5'  9" (1.753 m)   Wt 83 kg   SpO2 100%   BMI 27.02 kg/m   Physical Exam Vitals signs and nursing note reviewed.  Constitutional:      Appearance: He is well-developed.  HENT:     Head: Normocephalic and atraumatic.  Eyes:     General: No scleral icterus.       Right eye: No discharge.        Left eye: No discharge.     Conjunctiva/sclera: Conjunctivae normal.     Pupils: Pupils are equal, round, and reactive to light.  Neck:     Musculoskeletal: Normal range of motion.     Vascular: No JVD.     Trachea: No tracheal deviation.  Pulmonary:     Effort: Pulmonary effort is normal.     Breath sounds: No stridor.  Musculoskeletal:     Comments: Right  foot atraumatic with tenderness over the dorsal aspect no significant erythema, ankle full range of motion  Neurological:     Mental Status: He is alert and oriented to person, place, and time.     Coordination: Coordination normal.  Psychiatric:        Behavior: Behavior normal.        Thought Content: Thought content normal.        Judgment: Judgment normal.      ED Treatments / Results  Labs (all labs ordered are listed, but only abnormal results are displayed) Labs Reviewed - No data to display  EKG None  Radiology No results found.  Procedures Procedures (including critical care time)  Medications Ordered in ED Medications - No data to display   Initial Impression / Assessment and Plan / ED Course  I have reviewed the triage vital signs and the nursing notes.  Pertinent labs & imaging results that were available during my care of the patient were reviewed by me and considered in my medical decision making (see chart for details).         Assessment/Plan: 53 year old male presents today with complaints of gout.  No signs of infectious etiology or trauma.  To be treated with steroids and pain medication, allopurinol and colchicine prescribed he understands that these are not for his acute flare but for recurrence.  He follows up with his primary care later this month he is encouraged to maintain this follow-up appointment.  Return precautions given.  Verbalized understanding and agreement to today's plan.    Final Clinical Impressions(s) / ED Diagnoses   Final diagnoses:  Acute gout of right foot, unspecified cause    ED Discharge Orders         Ordered    colchicine 0.6 MG tablet  2 times daily     06/29/19 1341    allopurinol (ZYLOPRIM) 300 MG tablet  Daily     06/29/19 1341    predniSONE (DELTASONE) 20 MG tablet  Daily     06/29/19 1342    HYDROcodone-acetaminophen (NORCO/VICODIN) 5-325 MG tablet  Every 6 hours PRN     06/29/19 1344            Eyvonne Mechanic, PA-C 06/30/19 1201    Gerhard Munch, MD 07/02/19 [phone]

## 2019-07-14 ENCOUNTER — Ambulatory Visit (INDEPENDENT_AMBULATORY_CARE_PROVIDER_SITE_OTHER): Payer: Self-pay | Admitting: Primary Care

## 2019-07-14 ENCOUNTER — Encounter (INDEPENDENT_AMBULATORY_CARE_PROVIDER_SITE_OTHER): Payer: Self-pay | Admitting: Primary Care

## 2019-07-14 ENCOUNTER — Other Ambulatory Visit: Payer: Self-pay

## 2019-07-14 VITALS — BP 149/72 | HR 73 | Temp 96.8°F | Ht 69.0 in | Wt 185.4 lb

## 2019-07-14 DIAGNOSIS — Z760 Encounter for issue of repeat prescription: Secondary | ICD-10-CM

## 2019-07-14 DIAGNOSIS — M109 Gout, unspecified: Secondary | ICD-10-CM

## 2019-07-14 DIAGNOSIS — Z23 Encounter for immunization: Secondary | ICD-10-CM

## 2019-07-14 DIAGNOSIS — I10 Essential (primary) hypertension: Secondary | ICD-10-CM

## 2019-07-14 MED ORDER — AMLODIPINE BESYLATE 5 MG PO TABS: 10.0000 mg | ORAL_TABLET | Freq: Every day | ORAL | 0 refills | Status: DC

## 2019-07-14 MED FILL — AMLODIPINE BESYLATE 5 MG TA: 5 | 30 days supply | Qty: 30 | Fill #0

## 2019-07-14 NOTE — Patient Instructions (Signed)
Gout  Gout is painful swelling of your joints. Gout is a type of arthritis. It is caused by having too much uric acid in your body. Uric acid is a chemical that is made when your body breaks down substances called purines. If your body has too much uric acid, sharp crystals can form and build up in your joints. This causes pain and swelling. Gout attacks can happen quickly and be very painful (acute gout). Over time, the attacks can affect more joints and happen more often (chronic gout). What are the causes?  Too much uric acid in your blood. This can happen because: ? Your kidneys do not remove enough uric acid from your blood. ? Your body makes too much uric acid. ? You eat too many foods that are high in purines. These foods include organ meats, some seafood, and beer.  Trauma or stress. What increases the risk?  Having a family history of gout.  Being male and middle-aged.  Being male and having gone through menopause.  Being very overweight (obese).  Drinking alcohol, especially beer.  Not having enough water in the body (being dehydrated).  Losing weight too quickly.  Having an organ transplant.  Having lead poisoning.  Taking certain medicines.  Having kidney disease.  Having a skin condition called psoriasis. What are the signs or symptoms? An attack of acute gout usually happens in just one joint. The most common place is the big toe. Attacks often start at night. Other joints that may be affected include joints of the feet, ankle, knee, fingers, wrist, or elbow. Symptoms of an attack may include:  Very bad pain.  Warmth.  Swelling.  Stiffness.  Shiny, red, or purple skin.  Tenderness. The affected joint may be very painful to touch.  Chills and fever. Chronic gout may cause symptoms more often. More joints may be involved. You may also have white or yellow lumps (tophi) on your hands or feet or in other areas near your joints. How is this treated?   Treatment for this condition has two phases: treating an acute attack and preventing future attacks.  Acute gout treatment may include: ? NSAIDs. ? Steroids. These are taken by mouth or injected into a joint. ? Colchicine. This medicine relieves pain and swelling. It can be given by mouth or through an IV tube.  Preventive treatment may include: ? Taking small doses of NSAIDs or colchicine daily. ? Using a medicine that reduces uric acid levels in your blood. ? Making changes to your diet. You may need to see a food expert (dietitian) about what to eat and drink to prevent gout. Follow these instructions at home: During a gout attack   If told, put ice on the painful area: ? Put ice in a plastic bag. ? Place a towel between your skin and the bag. ? Leave the ice on for 20 minutes, 2-3 times a day.  Raise (elevate) the painful joint above the level of your heart as often as you can.  Rest the joint as much as possible. If the joint is in your leg, you may be given crutches.  Follow instructions from your doctor about what you cannot eat or drink. Avoiding future gout attacks  Eat a low-purine diet. Avoid foods and drinks such as: ? Liver. ? Kidney. ? Anchovies. ? Asparagus. ? Herring. ? Mushrooms. ? Mussels. ? Beer.  Stay at a healthy weight. If you want to lose weight, talk with your doctor. Do not lose weight  too fast.  Start or continue an exercise plan as told by your doctor. Eating and drinking  Drink enough fluids to keep your pee (urine) pale yellow.  If you drink alcohol: ? Limit how much you use to:  0-1 drink a day for women.  0-2 drinks a day for men. ? Be aware of how much alcohol is in your drink. In the [place] one drink equals one 12 oz bottle of beer (355 mL), one 5 oz glass of wine (148 mL), or one 1 oz glass of hard liquor (44 mL). General instructions  Take over-the-counter and prescription medicines only as told by your doctor.  Do not drive or  use heavy machinery while taking prescription pain medicine.  Return to your normal activities as told by your doctor. Ask your doctor what activities are safe for you.  Keep all follow-up visits as told by your doctor. This is important. Contact a doctor if:  You have another gout attack.  You still have symptoms of a gout attack after 10 days of treatment.  You have problems (side effects) because of your medicines.  You have chills or a fever.  You have burning pain when you pee (urinate).  You have pain in your lower back or belly. Get help right away if:  You have very bad pain.  Your pain cannot be controlled.  You cannot pee. Summary  Gout is painful swelling of the joints.  The most common site of pain is the big toe, but it can affect other joints.  Medicines and avoiding some foods can help to prevent and treat gout attacks. This information is not intended to replace advice given to you by your health care provider. Make sure you discuss any questions you have with your health care provider. Document Released: 05/22/2008 Document Revised: 03/05/2018 Document Reviewed: 03/05/2018 Elsevier Patient Education  2020 [employer]. Hypertension, Adult Hypertension is another name for high blood pressure. High blood pressure forces your heart to work harder to pump blood. This can cause problems over time. There are two numbers in a blood pressure reading. There is a top number (systolic) over a bottom number (diastolic). It is best to have a blood pressure that is below 120/80. Healthy choices can help lower your blood pressure, or you may need medicine to help lower it. What are the causes? The cause of this condition is not known. Some conditions may be related to high blood pressure. What increases the risk?  Smoking.  Having type 2 diabetes mellitus, high cholesterol, or both.  Not getting enough exercise or physical activity.  Being overweight.  Having too  much fat, sugar, calories, or salt (sodium) in your diet.  Drinking too much alcohol.  Having long-term (chronic) kidney disease.  Having a family history of high blood pressure.  Age. Risk increases with age.  Race. You may be at higher risk if you are African American.  Gender. Men are at higher risk than women before age 73. After age 48, women are at higher risk than men.  Having obstructive sleep apnea.  Stress. What are the signs or symptoms?  High blood pressure may not cause symptoms. Very high blood pressure (hypertensive crisis) may cause: ? Headache. ? Feelings of worry or nervousness (anxiety). ? Shortness of breath. ? Nosebleed. ? A feeling of being sick to your stomach (nausea). ? Throwing up (vomiting). ? Changes in how you see. ? Very bad chest pain. ? Seizures. How is this treated?  This condition is treated by making healthy lifestyle changes, such as: ? Eating healthy foods. ? Exercising more. ? Drinking less alcohol.  Your health care provider may prescribe medicine if lifestyle changes are not enough to get your blood pressure under control, and if: ? Your top number is above 130. ? Your bottom number is above 80.  Your personal target blood pressure may vary. Follow these instructions at home: Eating and drinking   If told, follow the DASH eating plan. To follow this plan: ? Fill one half of your plate at each meal with fruits and vegetables. ? Fill one fourth of your plate at each meal with whole grains. Whole grains include whole-wheat pasta, brown rice, and whole-grain bread. ? Eat or drink low-fat dairy products, such as skim milk or low-fat yogurt. ? Fill one fourth of your plate at each meal with low-fat (lean) proteins. Low-fat proteins include fish, chicken without skin, eggs, beans, and tofu. ? Avoid fatty meat, cured and processed meat, or chicken with skin. ? Avoid pre-made or processed food.  Eat less than 1,500 mg of salt each  day.  Do not drink alcohol if: ? Your doctor tells you not to drink. ? You are pregnant, may be pregnant, or are planning to become pregnant.  If you drink alcohol: ? Limit how much you use to:  0-1 drink a day for women.  0-2 drinks a day for men. ? Be aware of how much alcohol is in your drink. In the [place] one drink equals one 12 oz bottle of beer (355 mL), one 5 oz glass of wine (148 mL), or one 1 oz glass of hard liquor (44 mL). Lifestyle   Work with your doctor to stay at a healthy weight or to lose weight. Ask your doctor what the best weight is for you.  Get at least 30 minutes of exercise most days of the week. This may include walking, swimming, or biking.  Get at least 30 minutes of exercise that strengthens your muscles (resistance exercise) at least 3 days a week. This may include lifting weights or doing Pilates.  Do not use any products that contain nicotine or tobacco, such as cigarettes, e-cigarettes, and chewing tobacco. If you need help quitting, ask your doctor.  Check your blood pressure at home as told by your doctor.  Keep all follow-up visits as told by your doctor. This is important. Medicines  Take over-the-counter and prescription medicines only as told by your doctor. Follow directions carefully.  Do not skip doses of blood pressure medicine. The medicine does not work as well if you skip doses. Skipping doses also puts you at risk for problems.  Ask your doctor about side effects or reactions to medicines that you should watch for. Contact a doctor if you:  Think you are having a reaction to the medicine you are taking.  Have headaches that keep coming back (recurring).  Feel dizzy.  Have swelling in your ankles.  Have trouble with your vision. Get help right away if you:  Get a very bad headache.  Start to feel mixed up (confused).  Feel weak or numb.  Feel faint.  Have very bad pain in your: ? Chest. ? Belly (abdomen).  Throw  up more than once.  Have trouble breathing. Summary  Hypertension is another name for high blood pressure.  High blood pressure forces your heart to work harder to pump blood.  For most people, a normal blood pressure is less  than 120/80.  Making healthy choices can help lower blood pressure. If your blood pressure does not get lower with healthy choices, you may need to take medicine. This information is not intended to replace advice given to you by your health care provider. Make sure you discuss any questions you have with your health care provider. Document Released: 01/30/2008 Document Revised: 04/23/2018 Document Reviewed: 04/23/2018 Elsevier Patient Education  2020 [employer].

## 2019-07-15 LAB — CMP14+EGFR
ALT: 10 IU/L (ref 0–44)
AST: 17 IU/L (ref 0–40)
Albumin/Globulin Ratio: 1.6 (ref 1.2–2.2)
Albumin: 4.4 g/dL (ref 3.8–4.9)
Alkaline Phosphatase: 99 IU/L (ref 39–117)
BUN/Creatinine Ratio: 8 — ABNORMAL LOW (ref 9–20)
BUN: 11 mg/dL (ref 6–24)
Bilirubin Total: 0.9 mg/dL (ref 0.0–1.2)
CO2: 20 mmol/L (ref 20–29)
Calcium: 9.7 mg/dL (ref 8.7–10.2)
Chloride: 105 mmol/L (ref 96–106)
Creatinine, Ser: 1.37 mg/dL — ABNORMAL HIGH (ref 0.76–1.27)
GFR calc Af Amer: 67 mL/min/{1.73_m2} (ref >59)
GFR calc non Af Amer: 58 mL/min/{1.73_m2} — ABNORMAL LOW (ref >59)
Globulin, Total: 2.7 g/dL (ref 1.5–4.5)
Glucose: 94 mg/dL (ref 65–99)
Potassium: 4.2 mmol/L (ref 3.5–5.2)
Sodium: 141 mmol/L (ref 134–144)
Total Protein: 7.1 g/dL (ref 6.0–8.5)

## 2019-07-15 LAB — URIC ACID: Uric Acid: 9 mg/dL — ABNORMAL HIGH (ref 3.7–8.6)

## 2019-07-19 NOTE — Progress Notes (Signed)
Established Patient Office Visit  Subjective:  Patient ID: Brian Erickson, male    DOB: 07/30/1966  Age: 53 y.o. MRN: [id number]  CC:  Chief Complaint  Patient presents with  . Blood Pressure Check  . uric acid    HPI Brian Erickson presents for management of blood pressure today he present with elevated Bp 149/72 he denies shortness of breath, headaches, chest pain or lower extremity edema and complains of gout flare ups.  Past Medical History:  Diagnosis Date  . Felon of finger of left hand 01/26/2018  . 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 GERD (gastroesophageal reflux disease)   . Gout   . Infection of thumb    left +MRSA  . MRSA (methicillin resistant staph aureus) culture positive    current infection left thumb    Past Surgical History:  Procedure Laterality Date  . I&D EXTREMITY Left 01/26/2018   Procedure: IRRIGATION AND DEBRIDEMENT LEFT THUMB;  Surgeon: Teryl LucyLandau, Joshua, MD;  Location: [facility] OR;  Service: Orthopedics;  Laterality: Left;  . I&D EXTREMITY Left 02/12/2018   Procedure: IRRIGATION AND DEBRIDEMENT OF LEFT THUMB;  Surgeon: Teryl LucyLandau, Joshua, MD;  Location: [facility];  Service: Orthopedics;  Laterality: Left;    History reviewed. No pertinent family history.  Social History   Socioeconomic History  . Marital status: Single    Spouse name: Not on file  . Number of children: Not on file  . Years of education: Not on file  . Highest education level: Not on file  Occupational History  . Not on file  Social Needs  . Financial resource strain: Not on file  . Food insecurity    Worry: Not on file    Inability: Not on file  . Transportation needs    Medical: Not on file    Non-medical: Not on file  Tobacco Use  . Smoking status: Never Smoker  . Smokeless tobacco: Never Used  Substance and Sexual Activity  . Alcohol use: No    Comment: socially   . Drug use: No  . Sexual activity: Not on file  Lifestyle  . Physical activity     Days per week: Not on file    Minutes per session: Not on file  . Stress: Not on file  Relationships  . Social Musicianconnections    Talks on phone: Not on file    Gets together: Not on file    Attends religious service: Not on file    Active member of club or organization: Not on file    Attends meetings of clubs or organizations: Not on file    Relationship status: Not on file  . Intimate partner violence    Fear of current or ex partner: Not on file    Emotionally abused: Not on file    Physically abused: Not on file    Forced sexual activity: Not on file  Other Topics Concern  . Not on file  Social History Narrative  . Not on file    Outpatient Medications Prior to Visit  Medication Sig Dispense Refill  . acetaminophen (TYLENOL) 500 MG tablet Take 500 mg by mouth every 6 (six) hours as needed.    Marland Kitchen. allopurinol (ZYLOPRIM) 300 MG tablet Take 1 tablet (300 mg total) by mouth daily. 30 tablet 3  . colchicine 0.6 MG tablet Take 1 tablet (0.6 mg total) by mouth 2 (two) times daily. 30 tablet 0  . HYDROcodone-acetaminophen (NORCO/VICODIN) 5-325 MG tablet Take 1  tablet by mouth every 6 (six) hours as needed. 6 tablet 0  . predniSONE (DELTASONE) 20 MG tablet Take 2 tablets (40 mg total) by mouth daily. 10 tablet 0   No facility-administered medications prior to visit.     Allergies  Allergen Reactions  . Daptomycin     Rhabdomyolysis     ROS Review of Systems  Musculoskeletal: Positive for arthralgias.      Objective:    Physical Exam  Constitutional: He is oriented to person, place, and time. He appears well-developed and well-nourished.  HENT:  Head: Normocephalic.  Eyes: Pupils are equal, round, and reactive to light. EOM are normal.  Neck: Neck supple.  Cardiovascular: Normal rate and regular rhythm.  Pulmonary/Chest: Effort normal and breath sounds normal.  Abdominal: Soft. Bowel sounds are normal.  Musculoskeletal: Normal range of motion.  Neurological: He is  oriented to person, place, and time.  Skin: Skin is warm.  Psychiatric: He has a normal mood and affect. His behavior is normal. Thought content normal.    BP (!) 149/72 (BP Location: Left Arm, Patient Position: Sitting, Cuff Size: Normal)   Pulse 73   Temp (!) 96.8 F (36 C) (Temporal)   Ht 5\' 9"  (1.753 m)   Wt 185 lb 6.4 oz (84.1 kg)   SpO2 96%   BMI 27.38 kg/m  Wt Readings from Last 3 Encounters:  07/14/19 185 lb 6.4 oz (84.1 kg)  06/29/19 183 lb (83 kg)  04/13/19 190 lb (86.2 kg)     Health Maintenance Due  Topic Date Due  . COLONOSCOPY  08/29/2013    There are no preventive care reminders to display for this patient.  No results found for: TSH Lab Results  Component Value Date   WBC 5.9 03/02/2019   HGB 15.4 03/02/2019   HCT 46.0 03/02/2019   MCV 87 03/02/2019   PLT 321 03/02/2019   Lab Results  Component Value Date   NA 141 07/14/2019   K 4.2 07/14/2019   CO2 20 07/14/2019   GLUCOSE 94 07/14/2019   BUN 11 07/14/2019   CREATININE 1.37 (H) 07/14/2019   BILITOT 0.9 07/14/2019   ALKPHOS 99 07/14/2019   AST 17 07/14/2019   ALT 10 07/14/2019   PROT 7.1 07/14/2019   ALBUMIN 4.4 07/14/2019   CALCIUM 9.7 07/14/2019   ANIONGAP 7 03/01/2018   No results found for: CHOL No results found for: HDL No results found for: LDLCALC No results found for: TRIG No results found for: CHOLHDL No results found for: 05/02/2018    Assessment & Plan:   Brian Erickson was seen today for blood pressure check and uric acid.  Diagnoses and all orders for this visit:  Gout, unspecified cause, unspecified chronicity, unspecified site  Eating foods with purines such as red meats and seafoods, alcohol smoking, and obesity  -     Uric Acid  Essential hypertension Counseled on blood pressure goal of less than 130/80, low-sodium diet, medication compliance, 150 minutes of moderate intensity exercise per week. Emphasize risk of heart attack or stroke with uncontrolled blood pressure   Discussed medication compliance, adverse effects.  -     Complete Metabolic Panel with GFR -     amLODipine (NORVASC) 5 MG tablet; Take 2 tablets (10 mg total) by mouth daily.  Medication refill Amlodipine 5 mg take 2 tablet daily until gone than take increase dosage of 10 mg daily.  Need for immunization against influenza -     Flu Vaccine QUAD 36+  mos IM    Meds ordered this encounter  Medications  . amLODipine (NORVASC) 5 MG tablet    Sig: Take 2 tablets (10 mg total) by mouth daily.    Dispense:  90 tablet    Refill:  0    Follow-up: Return for 6-10 weeks on Blood pressure medication.    Kerin Perna, NP

## 2019-08-31 ENCOUNTER — Emergency Department (HOSPITAL_COMMUNITY)
Admission: EM | Admit: 2019-08-31 | Discharge: 2019-08-31 | Disposition: A | Discharge status: Home / Self Care | Payer: Medicaid Other | Attending: Emergency Medicine | Admitting: Emergency Medicine

## 2019-08-31 ENCOUNTER — Other Ambulatory Visit: Payer: Self-pay

## 2019-08-31 DIAGNOSIS — Z79899 Other long term (current) drug therapy: Secondary | ICD-10-CM | POA: Insufficient documentation

## 2019-08-31 DIAGNOSIS — H9312 Tinnitus, left ear: Secondary | ICD-10-CM

## 2019-08-31 DIAGNOSIS — J029 Acute pharyngitis, unspecified: Secondary | ICD-10-CM | POA: Insufficient documentation

## 2019-08-31 DIAGNOSIS — R519 Headache, unspecified: Secondary | ICD-10-CM | POA: Insufficient documentation

## 2019-08-31 LAB — GROUP A STREP BY PCR: Group A Strep by PCR: NOT DETECTED

## 2019-08-31 MED ORDER — PREDNISONE 20 MG PO TABS: 60.0000 mg | ORAL_TABLET | Freq: Once | ORAL | Status: DC

## 2019-08-31 MED ORDER — HYDROCODONE-ACETAMINOPHEN 5-325 MG PO TABS: 2.0000 | ORAL_TABLET | Freq: Once | ORAL | Status: DC

## 2019-08-31 NOTE — ED Provider Notes (Signed)
[facility] EMERGENCY DEPARTMENT Provider Note   CSN: [id number] Arrival date & time: 08/31/19  0434     History Chief Complaint  Patient presents with  . Otalgia  . Headache    ROLLO FARQUHAR is a 54 y.o. male.  With history of MRSA, reflux presents with left ear ringing and sore throat for 2 days.  Patient has appoint with his doctor tomorrow.  Patient denies fevers or infectious symptoms.  Patient denies any neurologic symptoms.  Gradual onset.  No history of similar.  Tylenol improves the pain.  No loss of vision, no temple tenderness.        Past Medical History:  Diagnosis Date  . Felon of finger of left hand 01/26/2018  . 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 GERD (gastroesophageal reflux disease)   . Gout   . Infection of thumb    left +MRSA  . MRSA (methicillin resistant staph aureus) culture positive    current infection left thumb    Patient Active Problem List   Diagnosis Date Noted  . Medication monitoring encounter 02/20/2018  . Rash and nonspecific skin eruption 02/20/2018  . 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 Felon of finger of left hand 01/26/2018  . Screening examination for STD (sexually transmitted disease) 12/24/2016    Past Surgical History:  Procedure Laterality Date  . I & D EXTREMITY Left 01/26/2018   Procedure: IRRIGATION AND DEBRIDEMENT LEFT THUMB;  Surgeon: Teryl Lucy, MD;  Location: [facility] OR;  Service: Orthopedics;  Laterality: Left;  . I & D EXTREMITY Left 02/12/2018   Procedure: IRRIGATION AND DEBRIDEMENT OF LEFT THUMB;  Surgeon: Teryl Lucy, MD;  Location: [facility];  Service: Orthopedics;  Laterality: Left;       No family history on file.  Social History   Tobacco Use  . Smoking status: Never Smoker  . Smokeless tobacco: Never Used  Substance Use Topics  . Alcohol use: No    Comment: socially   . Drug use: No    Home Medications Prior to Admission medications     Medication Sig Start Date End Date Taking? Authorizing Provider  acetaminophen (TYLENOL) 500 MG tablet Take 500 mg by mouth every 6 (six) hours as needed.    [provider]  allopurinol (ZYLOPRIM) 300 MG tablet Take 1 tablet (300 mg total) by mouth daily. 06/29/19   Hedges, Tinnie Gens, PA-C  amLODipine (NORVASC) 5 MG tablet Take 2 tablets (10 mg total) by mouth daily. 07/14/19   Grayce Sessions, NP  colchicine 0.6 MG tablet Take 1 tablet (0.6 mg total) by mouth 2 (two) times daily. 06/29/19   Hedges, Tinnie Gens, PA-C  HYDROcodone-acetaminophen (NORCO/VICODIN) 5-325 MG tablet Take 1 tablet by mouth every 6 (six) hours as needed. 06/29/19   Hedges, Tinnie Gens, PA-C  predniSONE (DELTASONE) 20 MG tablet Take 2 tablets (40 mg total) by mouth daily. 06/29/19   Hedges, Tinnie Gens, PA-C    Allergies    Daptomycin  Review of Systems   Review of Systems  Constitutional: Negative for chills and fever.  HENT: Negative for congestion.   Eyes: Negative for visual disturbance.  Respiratory: Negative for shortness of breath.   Cardiovascular: Negative for chest pain.  Gastrointestinal: Negative for abdominal pain and vomiting.  Genitourinary: Negative for dysuria and flank pain.  Musculoskeletal: Negative for back pain, neck pain and neck stiffness.  Skin: Negative for rash.  Neurological: Positive for headaches. Negative for light-headedness.    Physical Exam Updated  Vital Signs BP (!) 160/84 (BP Location: Right Arm)   Pulse (!) 58   Temp 98.2 F (36.8 C) (Oral)   Resp 18   SpO2 100%   Physical Exam Vitals and nursing note reviewed.  Constitutional:      Appearance: He is well-developed.  HENT:     Head: Normocephalic and atraumatic.  Eyes:     General:        Right eye: No discharge.        Left eye: No discharge.     Conjunctiva/sclera: Conjunctivae normal.  Neck:     Trachea: No tracheal deviation.  Cardiovascular:     Rate and Rhythm: Normal rate.  Pulmonary:     Effort:  Pulmonary effort is normal.  Abdominal:     Palpations: Abdomen is soft.  Musculoskeletal:     Cervical back: Normal range of motion and neck supple.  Skin:    General: Skin is warm.     Findings: No rash.  Neurological:     Mental Status: He is alert and oriented to person, place, and time.     GCS: GCS eye subscore is 4. GCS verbal subscore is 5. GCS motor subscore is 6.     Cranial Nerves: No cranial nerve deficit.     Sensory: Sensation is intact.     Motor: Motor function is intact. No weakness.     Coordination: Coordination is intact.     Gait: Gait is intact.     ED Results / Procedures / Treatments   Labs (all labs ordered are listed, but only abnormal results are displayed) Labs Reviewed  GROUP A STREP BY PCR    EKG None  Radiology No results found.  Procedures Procedures (including critical care time)  Medications Ordered in ED Medications - No data to display  ED Course  I have reviewed the triage vital signs and the nursing notes.  Pertinent labs & imaging results that were available during my care of the patient were reviewed by me and considered in my medical decision making (see chart for details).    MDM Rules/Calculators/A&P                      Patient presents with ringing in the left ear, recent sore throat without sign of abscess on exam.  Discussed possibility of viral versus strep.  Normal neurologic exam in the ER.  Patient is appointment doctor for reassessment tomorrow.  No indication for emergent imaging at this time.  Patient needs work note and says he will follow-up strep results with primary doctor tomorrow.  ANAS REISTER was evaluated in Emergency Department on 08/31/2019 for the symptoms described in the history of present illness. He was evaluated in the context of the global COVID-19 pandemic, which necessitated consideration that the patient might be at risk for infection with the SARS-CoV-2 virus that causes COVID-19.  Institutional protocols and algorithms that pertain to the evaluation of patients at risk for COVID-19 are in a state of rapid change based on information released by regulatory bodies including the CDC and federal and state organizations. These policies and algorithms were followed during the patient's care in the ED.  Final Clinical Impression(s) / ED Diagnoses Final diagnoses:  Tinnitus, left ear  Sore throat    Rx / DC Orders ED Discharge Orders    None       Elnora Morrison, MD 08/31/19 1012

## 2019-08-31 NOTE — Discharge Instructions (Addendum)
Follow-up your strep test result.  Take Tylenol as needed for pain.  Stay well-hydrated. Return for difficulty swallowing, persistent fevers or new concerns.

## 2019-08-31 NOTE — ED Triage Notes (Signed)
Pt reports ringing in left ear for several days, causing left side headache. No distress noted at triage.

## 2019-09-01 ENCOUNTER — Ambulatory Visit (INDEPENDENT_AMBULATORY_CARE_PROVIDER_SITE_OTHER): Payer: Self-pay | Admitting: Primary Care

## 2019-09-01 ENCOUNTER — Encounter (INDEPENDENT_AMBULATORY_CARE_PROVIDER_SITE_OTHER): Payer: Self-pay | Admitting: Primary Care

## 2019-09-01 VITALS — BP 148/91 | HR 70 | Temp 97.6°F | Ht 69.0 in | Wt 188.6 lb

## 2019-09-01 DIAGNOSIS — Z13220 Encounter for screening for lipoid disorders: Secondary | ICD-10-CM

## 2019-09-01 DIAGNOSIS — Z1211 Encounter for screening for malignant neoplasm of colon: Secondary | ICD-10-CM

## 2019-09-01 DIAGNOSIS — Z760 Encounter for issue of repeat prescription: Secondary | ICD-10-CM

## 2019-09-01 DIAGNOSIS — M109 Gout, unspecified: Secondary | ICD-10-CM

## 2019-09-01 DIAGNOSIS — I10 Essential (primary) hypertension: Secondary | ICD-10-CM

## 2019-09-01 MED ORDER — AMLODIPINE BESYLATE 5 MG PO TABS: 10.0000 mg | ORAL_TABLET | Freq: Every day | ORAL | 1 refills | Status: DC

## 2019-09-01 MED ORDER — ALLOPURINOL 300 MG PO TABS: 300.0000 mg | ORAL_TABLET | Freq: Every day | ORAL | 1 refills | Status: DC

## 2019-09-01 MED ORDER — AMLODIPINE BESYLATE 10 MG PO TABS: 10.0000 mg | ORAL_TABLET | Freq: Every day | ORAL | 1 refills | Status: DC

## 2019-09-01 MED FILL — AMLODIPINE BESYLATE 5 MG TA: 5 | 30 days supply | Qty: 60 | Fill #0

## 2019-09-01 MED FILL — ALLOPURINOL 300 MG TAB: 300 | 30 days supply | Qty: 30 | Fill #0

## 2019-09-01 NOTE — Patient Instructions (Signed)
Gout  Gout is painful swelling of your joints. Gout is a type of arthritis. It is caused by having too much uric acid in your body. Uric acid is a chemical that is made when your body breaks down substances called purines. If your body has too much uric acid, sharp crystals can form and build up in your joints. This causes pain and swelling. Gout attacks can happen quickly and be very painful (acute gout). Over time, the attacks can affect more joints and happen more often (chronic gout). What are the causes?  Too much uric acid in your blood. This can happen because: ? Your kidneys do not remove enough uric acid from your blood. ? Your body makes too much uric acid. ? You eat too many foods that are high in purines. These foods include organ meats, some seafood, and beer.  Trauma or stress. What increases the risk?  Having a family history of gout.  Being male and middle-aged.  Being male and having gone through menopause.  Being very overweight (obese).  Drinking alcohol, especially beer.  Not having enough water in the body (being dehydrated).  Losing weight too quickly.  Having an organ transplant.  Having lead poisoning.  Taking certain medicines.  Having kidney disease.  Having a skin condition called psoriasis. What are the signs or symptoms? An attack of acute gout usually happens in just one joint. The most common place is the big toe. Attacks often start at night. Other joints that may be affected include joints of the feet, ankle, knee, fingers, wrist, or elbow. Symptoms of an attack may include:  Very bad pain.  Warmth.  Swelling.  Stiffness.  Shiny, red, or purple skin.  Tenderness. The affected joint may be very painful to touch.  Chills and fever. Chronic gout may cause symptoms more often. More joints may be involved. You may also have white or yellow lumps (tophi) on your hands or feet or in other areas near your joints. How is this  treated?  Treatment for this condition has two phases: treating an acute attack and preventing future attacks.  Acute gout treatment may include: ? NSAIDs. ? Steroids. These are taken by mouth or injected into a joint. ? Colchicine. This medicine relieves pain and swelling. It can be given by mouth or through an IV tube.  Preventive treatment may include: ? Taking small doses of NSAIDs or colchicine daily. ? Using a medicine that reduces uric acid levels in your blood. ? Making changes to your diet. You may need to see a food expert (dietitian) about what to eat and drink to prevent gout. Follow these instructions at home: During a gout attack   If told, put ice on the painful area: ? Put ice in a plastic bag. ? Place a towel between your skin and the bag. ? Leave the ice on for 20 minutes, 2-3 times a day.  Raise (elevate) the painful joint above the level of your heart as often as you can.  Rest the joint as much as possible. If the joint is in your leg, you may be given crutches.  Follow instructions from your doctor about what you cannot eat or drink. Avoiding future gout attacks  Eat a low-purine diet. Avoid foods and drinks such as: ? Liver. ? Kidney. ? Anchovies. ? Asparagus. ? Herring. ? Mushrooms. ? Mussels. ? Beer.  Stay at a healthy weight. If you want to lose weight, talk with your doctor. Do not lose weight   too fast.  Start or continue an exercise plan as told by your doctor. Eating and drinking  Drink enough fluids to keep your pee (urine) pale yellow.  If you drink alcohol: ? Limit how much you use to:  0-1 drink a day for women.  0-2 drinks a day for men. ? Be aware of how much alcohol is in your drink. In the [place] one drink equals one 12 oz bottle of beer (355 mL), one 5 oz glass of wine (148 mL), or one 1 oz glass of hard liquor (44 mL). General instructions  Take over-the-counter and prescription medicines only as told by your doctor.  Do  not drive or use heavy machinery while taking prescription pain medicine.  Return to your normal activities as told by your doctor. Ask your doctor what activities are safe for you.  Keep all follow-up visits as told by your doctor. This is important. Contact a doctor if:  You have another gout attack.  You still have symptoms of a gout attack after 10 days of treatment.  You have problems (side effects) because of your medicines.  You have chills or a fever.  You have burning pain when you pee (urinate).  You have pain in your lower back or belly. Get help right away if:  You have very bad pain.  Your pain cannot be controlled.  You cannot pee. Summary  Gout is painful swelling of the joints.  The most common site of pain is the big toe, but it can affect other joints.  Medicines and avoiding some foods can help to prevent and treat gout attacks. This information is not intended to replace advice given to you by your health care provider. Make sure you discuss any questions you have with your health care provider. Document Revised: 03/05/2018 Document Reviewed: 03/05/2018 Elsevier Patient Education  2020 [employer]. Managing Your Hypertension Hypertension is commonly called high blood pressure. This is when the force of your blood pressing against the walls of your arteries is too strong. Arteries are blood vessels that carry blood from your heart throughout your body. Hypertension forces the heart to work harder to pump blood, and may cause the arteries to become narrow or stiff. Having untreated or uncontrolled hypertension can cause heart attack, stroke, kidney disease, and other problems. What are blood pressure readings? A blood pressure reading consists of a higher number over a lower number. Ideally, your blood pressure should be below 120/80. The first ("top") number is called the systolic pressure. It is a measure of the pressure in your arteries as your heart  beats. The second ("bottom") number is called the diastolic pressure. It is a measure of the pressure in your arteries as the heart relaxes. What does my blood pressure reading mean? Blood pressure is classified into four stages. Based on your blood pressure reading, your health care provider may use the following stages to determine what type of treatment you need, if any. Systolic pressure and diastolic pressure are measured in a unit called mm Hg. Normal  Systolic pressure: below 120.  Diastolic pressure: below 80. Elevated  Systolic pressure: 120-129.  Diastolic pressure: below 80. Hypertension stage 1  Systolic pressure: 130-139.  Diastolic pressure: 80-89. Hypertension stage 2  Systolic pressure: 140 or above.  Diastolic pressure: 90 or above. What health risks are associated with hypertension? Managing your hypertension is an important responsibility. Uncontrolled hypertension can lead to:  A heart attack.  A stroke.  A weakened blood vessel (  aneurysm).  Heart failure.  Kidney damage.  Eye damage.  Metabolic syndrome.  Memory and concentration problems. What changes can I make to manage my hypertension? Hypertension can be managed by making lifestyle changes and possibly by taking medicines. Your health care provider will help you make a plan to bring your blood pressure within a normal range. Eating and drinking   Eat a diet that is high in fiber and potassium, and low in salt (sodium), added sugar, and fat. An example eating plan is called the DASH (Dietary Approaches to Stop Hypertension) diet. To eat this way: ? Eat plenty of fresh fruits and vegetables. Try to fill half of your plate at each meal with fruits and vegetables. ? Eat whole grains, such as whole wheat pasta, brown rice, or whole grain bread. Fill about one quarter of your plate with whole grains. ? Eat low-fat diary products. ? Avoid fatty cuts of meat, processed or cured meats, and poultry  with skin. Fill about one quarter of your plate with lean proteins such as fish, chicken without skin, beans, eggs, and tofu. ? Avoid premade and processed foods. These tend to be higher in sodium, added sugar, and fat.  Reduce your daily sodium intake. Most people with hypertension should eat less than 1,500 mg of sodium a day.  Limit alcohol intake to no more than 1 drink a day for nonpregnant women and 2 drinks a day for men. One drink equals 12 oz of beer, 5 oz of wine, or 1 oz of hard liquor. Lifestyle  Work with your health care provider to maintain a healthy body weight, or to lose weight. Ask what an ideal weight is for you.  Get at least 30 minutes of exercise that causes your heart to beat faster (aerobic exercise) most days of the week. Activities may include walking, swimming, or biking.  Include exercise to strengthen your muscles (resistance exercise), such as weight lifting, as part of your weekly exercise routine. Try to do these types of exercises for 30 minutes at least 3 days a week.  Do not use any products that contain nicotine or tobacco, such as cigarettes and e-cigarettes. If you need help quitting, ask your health care provider.  Control any long-term (chronic) conditions you have, such as high cholesterol or diabetes. Monitoring  Monitor your blood pressure at home as told by your health care provider. Your personal target blood pressure may vary depending on your medical conditions, your age, and other factors.  Have your blood pressure checked regularly, as often as told by your health care provider. Working with your health care provider  Review all the medicines you take with your health care provider because there may be side effects or interactions.  Talk with your health care provider about your diet, exercise habits, and other lifestyle factors that may be contributing to hypertension.  Visit your health care provider regularly. Your health care provider  can help you create and adjust your plan for managing hypertension. Will I need medicine to control my blood pressure? Your health care provider may prescribe medicine if lifestyle changes are not enough to get your blood pressure under control, and if:  Your systolic blood pressure is 130 or higher.  Your diastolic blood pressure is 80 or higher. Take medicines only as told by your health care provider. Follow the directions carefully. Blood pressure medicines must be taken as prescribed. The medicine does not work as well when you skip doses. Skipping doses also puts  you at risk for problems. Contact a health care provider if:  You think you are having a reaction to medicines you have taken.  You have repeated (recurrent) headaches.  You feel dizzy.  You have swelling in your ankles.  You have trouble with your vision. Get help right away if:  You develop a severe headache or confusion.  You have unusual weakness or numbness, or you feel faint.  You have severe pain in your chest or abdomen.  You vomit repeatedly.  You have trouble breathing. Summary  Hypertension is when the force of blood pumping through your arteries is too strong. If this condition is not controlled, it may put you at risk for serious complications.  Your personal target blood pressure may vary depending on your medical conditions, your age, and other factors. For most people, a normal blood pressure is less than 120/80.  Hypertension is managed by lifestyle changes, medicines, or both. Lifestyle changes include weight loss, eating a healthy, low-sodium diet, exercising more, and limiting alcohol. This information is not intended to replace advice given to you by your health care provider. Make sure you discuss any questions you have with your health care provider. Document Revised: 12/05/2018 Document Reviewed: 07/11/2016 Elsevier Patient Education  [employer].

## 2019-09-01 NOTE — Progress Notes (Signed)
Presents for  hypertension evaluation, on previous visit medication was adjusted to include amlodipine 10mg  daily . Patient never picked up Bp meds.  Patient is not  adherence with medications.  Current Medication List Current Outpatient Medications on File Prior to Visit  Medication Sig Dispense Refill  . allopurinol (ZYLOPRIM) 300 MG tablet Take 1 tablet (300 mg total) by mouth daily. 30 tablet 3  . colchicine 0.6 MG tablet Take 1 tablet (0.6 mg total) by mouth 2 (two) times daily. 30 tablet 0  . acetaminophen (TYLENOL) 500 MG tablet Take 500 mg by mouth every 6 (six) hours as needed.    Marland Kitchen amLODipine (NORVASC) 5 MG tablet Take 2 tablets (10 mg total) by mouth daily. (Patient not taking: Reported on 09/01/2019) 90 tablet 0   No current facility-administered medications on file prior to visit.   Past Medical History  Past Medical History:  Diagnosis Date  . Felon of finger of left hand 01/26/2018  . Finger osteomyelitis, left ([place]) 02/12/2018  . GERD (gastroesophageal reflux disease)   . Gout   . Infection of thumb    left +MRSA  . MRSA (methicillin resistant staph aureus) culture positive    current infection left thumb   Dietary habits include:  Exercise habits include:none  Family / Social history:   ASCVD risk factors include- [place]  O:  Physical Exam Vitals reviewed.  Constitutional:      Appearance: He is obese.  Cardiovascular:     Rate and Rhythm: Normal rate and regular rhythm.  Pulmonary:     Effort: Pulmonary effort is normal.     Breath sounds: Normal breath sounds.  Abdominal:     General: Bowel sounds are normal.  Musculoskeletal:        General: Normal range of motion.     Cervical back: Normal range of motion and neck supple.  Neurological:     Mental Status: He is alert and oriented to person, place, and time.  Psychiatric:        Mood and Affect: Mood normal.        Thought Content: Thought content normal.      Review of Systems  Neurological:  Positive for headaches.  All other systems reviewed and are negative.   Last 3 Office BP readings: BP Readings from Last 3 Encounters:  09/01/19 (!) 147/90  08/31/19 (!) 160/84  07/14/19 (!) 149/72    BMET    Component Value Date/Time   NA 141 07/14/2019 0925   K 4.2 07/14/2019 0925   CL 105 07/14/2019 0925   CO2 20 07/14/2019 0925   GLUCOSE 94 07/14/2019 0925   GLUCOSE 163 (H) 04/14/2018 1512   BUN 11 07/14/2019 0925   CREATININE 1.37 (H) 07/14/2019 0925   CREATININE 1.77 (H) 04/14/2018 1512   CALCIUM 9.7 07/14/2019 0925   GFRNONAA 58 (L) 07/14/2019 0925   GFRNONAA 43 (L) 04/14/2018 1512   GFRAA 67 07/14/2019 0925   GFRAA 49 (L) 04/14/2018 1512    Renal function: CrCl cannot be calculated (Patient's most recent lab result is older than the maximum 21 days allowed.).  Clinical ASCVD: yes The ASCVD Risk score Mikey Bussing DC Jr., et al., 2013) failed to calculate for the following reasons:   Cannot find a previous HDL lab   Cannot find a previous total cholesterol lab   A/P: Hypertension longstanding not on current medications. BP Goal = 130/80 mmHg. Patient is adherent with current medications. Prescribed   amLODipine (NORVASC) 10 MG tablet; Take  1 tablets (10 mg total) by mouth daily. Patient to pick up medication this week. -F/u labs ordered - Fasting lab,  -Counseled on lifestyle modifications for blood pressure control including reduced dietary sodium, increased exercise, adequate sleep  Special screening for malignant neoplasms, colon-   Cologuard is an easy to use noninvasive colon cancer screening test based on the latest advances in stool DNA science.    Lipid screening -     Lipid Panel; Future  Acute gout of right foot, unspecified cause Discussed food that should be eliminated from diet increase uric acid levels that is causing gout flare ups.. Food with increase in purines red meats and seafoods, alcohol smoking, and obesity greater than 30 -     allopurinol  (ZYLOPRIM) 300 MG tablet; Take 1 tablet (300 mg total) by mouth daily.  Medication refill   allopurinol (ZYLOPRIM) 300 MG tablet; Take 1 tablet (300 mg total) by mouth daily. Amlodipine 10mg  daily   Results reviewed and written information provided.   Total time in face-to-face counseling 16  minutes.   F/U Clinic Visit in 4 weeks  Patient seen with 

## 2019-09-08 ENCOUNTER — Other Ambulatory Visit: Payer: Self-pay

## 2019-09-08 ENCOUNTER — Other Ambulatory Visit (INDEPENDENT_AMBULATORY_CARE_PROVIDER_SITE_OTHER): Payer: Medicaid Other

## 2019-09-08 DIAGNOSIS — Z13220 Encounter for screening for lipoid disorders: Secondary | ICD-10-CM

## 2019-09-09 ENCOUNTER — Other Ambulatory Visit (INDEPENDENT_AMBULATORY_CARE_PROVIDER_SITE_OTHER): Payer: Self-pay | Admitting: Primary Care

## 2019-09-09 LAB — LIPID PANEL
Chol/HDL Ratio: 7.5 ratio — ABNORMAL HIGH (ref 0.0–5.0)
Cholesterol, Total: 269 mg/dL — ABNORMAL HIGH (ref 100–199)
HDL: 36 mg/dL — ABNORMAL LOW (ref >39)
LDL Chol Calc (NIH): 210 mg/dL — ABNORMAL HIGH (ref 0–99)
Triglycerides: 127 mg/dL (ref 0–149)
VLDL Cholesterol Cal: 23 mg/dL (ref 5–40)

## 2019-09-09 MED ORDER — PRAVASTATIN SODIUM 40 MG PO TABS: 40.0000 mg | ORAL_TABLET | Freq: Every day | ORAL | 1 refills | Status: DC

## 2019-09-10 MED FILL — PRAVASTATIN NA 40 MG TAB: 40 | 30 days supply | Qty: 30 | Fill #0

## 2019-09-14 ENCOUNTER — Telehealth (INDEPENDENT_AMBULATORY_CARE_PROVIDER_SITE_OTHER): Payer: Self-pay

## 2019-09-14 NOTE — Telephone Encounter (Signed)
Patient returned call. He is aware that his cholesterol is elevated. Advised to reduce fatty foods, red meat, cheese and milk and increase whole grains and veggies. Patient aware that pravastatin has been sent to his pharmacy. He verbalized understanding of results. Maryjean Morn, CMA

## 2019-09-14 NOTE — Telephone Encounter (Signed)
-----   Message from Grayce Sessions, NP sent at 09/09/2019  4:22 PM EST ----- Lipid panel elevated -cholesterol that can lead to heart attack and stroke. To lower your number you can decrease your fatty foods, red meat, cheese, milk and increase fiber like whole grains and veggies.  Sent in pravastatin 40mg  qhs

## 2019-09-29 ENCOUNTER — Ambulatory Visit (INDEPENDENT_AMBULATORY_CARE_PROVIDER_SITE_OTHER): Payer: Medicaid Other | Admitting: Primary Care

## 2019-10-08 ENCOUNTER — Encounter (INDEPENDENT_AMBULATORY_CARE_PROVIDER_SITE_OTHER): Payer: Self-pay | Admitting: Primary Care

## 2019-10-08 ENCOUNTER — Ambulatory Visit (INDEPENDENT_AMBULATORY_CARE_PROVIDER_SITE_OTHER): Payer: Self-pay | Admitting: Primary Care

## 2019-10-08 ENCOUNTER — Other Ambulatory Visit: Payer: Self-pay

## 2019-10-08 VITALS — BP 151/65 | HR 81 | Temp 97.0°F | Ht 69.0 in | Wt 187.6 lb

## 2019-10-08 DIAGNOSIS — M109 Gout, unspecified: Secondary | ICD-10-CM

## 2019-10-08 DIAGNOSIS — I10 Essential (primary) hypertension: Secondary | ICD-10-CM

## 2019-10-08 DIAGNOSIS — E782 Mixed hyperlipidemia: Secondary | ICD-10-CM

## 2019-10-08 NOTE — Patient Instructions (Signed)

## 2019-10-08 NOTE — Progress Notes (Signed)
Established Patient Office Visit  Subjective:  Patient ID: Brian Erickson, male    DOB: Sep 03, 1965  Age: 54 y.o. MRN: [id number]  CC:  Chief Complaint  Patient presents with  . Blood Pressure Check    HPI Brian Erickson presents for blood pressure follow up. Patient states taking his medication denies shortness of breath, headaches, chest pain or lower extremity edema. States feeling pretty good. Bp elevated today may be due to a agreement with brother prior to visit. He is not able to check his blood pressure at home. We will not use this reading for our follow up due to stress can elevate Bp.  Past Medical History:  Diagnosis Date  . Felon of finger of left hand 01/26/2018  . 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 GERD (gastroesophageal reflux disease)   . Gout   . Infection of thumb    left +MRSA  . MRSA (methicillin resistant staph aureus) culture positive    current infection left thumb    Past Surgical History:  Procedure Laterality Date  . I & D EXTREMITY Left 01/26/2018   Procedure: IRRIGATION AND DEBRIDEMENT LEFT THUMB;  Surgeon: Teryl Lucy, MD;  Location: [facility] OR;  Service: Orthopedics;  Laterality: Left;  . I & D EXTREMITY Left 02/12/2018   Procedure: IRRIGATION AND DEBRIDEMENT OF LEFT THUMB;  Surgeon: Teryl Lucy, MD;  Location: [facility];  Service: Orthopedics;  Laterality: Left;    No family history on file.  Social History   Socioeconomic History  . Marital status: Single    Spouse name: Not on file  . Number of children: Not on file  . Years of education: Not on file  . Highest education level: Not on file  Occupational History  . Not on file  Tobacco Use  . Smoking status: Never Smoker  . Smokeless tobacco: Never Used  Substance and Sexual Activity  . Alcohol use: No    Comment: socially   . Drug use: No  . Sexual activity: Not on file  Other Topics Concern  . Not on file  Social History Narrative  . Not on file    Social Determinants of Health   Financial Resource Strain:   . Difficulty of Paying Living Expenses: Not on file  Food Insecurity:   . Worried About Programme researcher, broadcasting/film/video in the Last Year: Not on file  . Ran Out of Food in the Last Year: Not on file  Transportation Needs:   . Lack of Transportation (Medical): Not on file  . Lack of Transportation (Non-Medical): Not on file  Physical Activity:   . Days of Exercise per Week: Not on file  . Minutes of Exercise per Session: Not on file  Stress:   . Feeling of Stress : Not on file  Social Connections:   . Frequency of Communication with Friends and Family: Not on file  . Frequency of Social Gatherings with Friends and Family: Not on file  . Attends Religious Services: Not on file  . Active Member of Clubs or Organizations: Not on file  . Attends Banker Meetings: Not on file  . Marital Status: Not on file  Intimate Partner Violence:   . Fear of Current or Ex-Partner: Not on file  . Emotionally Abused: Not on file  . Physically Abused: Not on file  . Sexually Abused: Not on file    Outpatient Medications Prior to Visit  Medication Sig Dispense Refill  . acetaminophen (TYLENOL)  500 MG tablet Take 500 mg by mouth every 6 (six) hours as needed.    Marland Kitchen allopurinol (ZYLOPRIM) 300 MG tablet Take 1 tablet (300 mg total) by mouth daily. 90 tablet 1  . amLODipine (NORVASC) 10 MG tablet Take 1 tablet (10 mg total) by mouth daily. 90 tablet 1  . pravastatin (PRAVACHOL) 40 MG tablet Take 1 tablet (40 mg total) by mouth daily. 90 tablet 1   No facility-administered medications prior to visit.    Allergies  Allergen Reactions  . Daptomycin     Rhabdomyolysis     ROS Review of Systems  HENT: Positive for dental problem.        Missing several teeth in front  All other systems reviewed and are negative.     Objective:    Physical Exam  Constitutional: He is oriented to person, place, and time. He appears well-developed  and well-nourished.  HENT:  Head: Normocephalic.  Cardiovascular: Normal rate and regular rhythm.  Pulmonary/Chest: Effort normal and breath sounds normal.  Abdominal: Bowel sounds are normal.  Musculoskeletal:        General: Normal range of motion.     Cervical back: Neck supple.  Neurological: He is oriented to person, place, and time.  Skin: Skin is warm and dry.  Psychiatric: He has a normal mood and affect. His behavior is normal. Judgment and thought content normal.    BP (!) 151/65 (BP Location: Left Arm, Patient Position: Sitting, Cuff Size: Normal)   Pulse 81   Temp (!) 97 F (36.1 C) (Temporal)   Ht 5\' 9"  (1.753 m)   Wt 187 lb 9.6 oz (85.1 kg)   SpO2 97%   BMI 27.70 kg/m  Wt Readings from Last 3 Encounters:  10/08/19 187 lb 9.6 oz (85.1 kg)  09/01/19 188 lb 9.6 oz (85.5 kg)  07/14/19 185 lb 6.4 oz (84.1 kg)     Health Maintenance Due  Topic Date Due  . COLONOSCOPY  08/29/2013    There are no preventive care reminders to display for this patient.  No results found for: TSH Lab Results  Component Value Date   WBC 5.9 03/02/2019   HGB 15.4 03/02/2019   HCT 46.0 03/02/2019   MCV 87 03/02/2019   PLT 321 03/02/2019   Lab Results  Component Value Date   NA 141 07/14/2019   K 4.2 07/14/2019   CO2 20 07/14/2019   GLUCOSE 94 07/14/2019   BUN 11 07/14/2019   CREATININE 1.37 (H) 07/14/2019   BILITOT 0.9 07/14/2019   ALKPHOS 99 07/14/2019   AST 17 07/14/2019   ALT 10 07/14/2019   PROT 7.1 07/14/2019   ALBUMIN 4.4 07/14/2019   CALCIUM 9.7 07/14/2019   ANIONGAP 7 03/01/2018   Lab Results  Component Value Date   CHOL 269 (H) 09/08/2019   Lab Results  Component Value Date   HDL 36 (L) 09/08/2019   Lab Results  Component Value Date   LDLCALC 210 (H) 09/08/2019   Lab Results  Component Value Date   TRIG 127 09/08/2019   Lab Results  Component Value Date   CHOLHDL 7.5 (H) 09/08/2019   No results found for: HGBA1C    Assessment & Plan:   Brian Erickson was seen today for blood pressure check.  Diagnoses and all orders for this visit:  Essential hypertension Counseled on blood pressure goal of less than 130/80, low-sodium, DASH diet, medication compliance, 150 minutes of moderate intensity exercise per week.  Discussed  Stress can  contribute to elevated blood pressure " 2 brothers is stress "  Gout, unspecified cause, unspecified chronicity, unspecified site Managed with Black seed oil ointment oral or rub ointment or put a few drops in drink feels it is working with allopurinol 300 mg daily. He is aware of foods that can cause flare ups red meats, seafood and alcohol.   Mixed hyperlipidemia Cholesterol that is elevated can lead to heart attack and stroke.  Decreasing your fatty foods, red meat, cheese, milk and increase fiber like whole grains and veggies. Voiced concerns about why he could not eat his cheerios with milk. Explain change milk from whole to 1% or 2% to decrease fat intake.   Follow-up: Return in about 6 months (around 04/06/2020) for fasting in person AM appt.    Kerin Perna, NP

## 2019-11-04 ENCOUNTER — Emergency Department (HOSPITAL_COMMUNITY)
Admission: EM | Admit: 2019-11-04 | Discharge: 2019-11-04 | Disposition: A | Discharge status: Home / Self Care | Payer: HRSA Program | Attending: Emergency Medicine | Admitting: Emergency Medicine

## 2019-11-04 ENCOUNTER — Other Ambulatory Visit: Payer: Self-pay

## 2019-11-04 ENCOUNTER — Encounter (HOSPITAL_COMMUNITY): Payer: Self-pay | Admitting: Emergency Medicine

## 2019-11-04 DIAGNOSIS — R070 Pain in throat: Secondary | ICD-10-CM | POA: Insufficient documentation

## 2019-11-04 DIAGNOSIS — J3489 Other specified disorders of nose and nasal sinuses: Secondary | ICD-10-CM | POA: Diagnosis not present

## 2019-11-04 DIAGNOSIS — Z79899 Other long term (current) drug therapy: Secondary | ICD-10-CM | POA: Diagnosis not present

## 2019-11-04 DIAGNOSIS — M7918 Myalgia, other site: Secondary | ICD-10-CM | POA: Diagnosis not present

## 2019-11-04 DIAGNOSIS — R6883 Chills (without fever): Secondary | ICD-10-CM | POA: Diagnosis present

## 2019-11-04 DIAGNOSIS — U071 COVID-19: Secondary | ICD-10-CM | POA: Insufficient documentation

## 2019-11-04 DIAGNOSIS — R519 Headache, unspecified: Secondary | ICD-10-CM | POA: Diagnosis not present

## 2019-11-04 LAB — POC SARS CORONAVIRUS 2 AG -  ED: SARS Coronavirus 2 Ag: POSITIVE — AB

## 2019-11-04 LAB — GROUP A STREP BY PCR: Group A Strep by PCR: NOT DETECTED

## 2019-11-04 MED ORDER — EPINEPHRINE 0.3 MG/0.3ML IJ SOAJ: 0.3000 mg | Freq: Once | INTRAMUSCULAR | Status: DC | PRN

## 2019-11-04 MED ORDER — ALBUTEROL SULFATE HFA 108 (90 BASE) MCG/ACT IN AERS: 1.0000 | INHALATION_SPRAY | Freq: Four times a day (QID) | RESPIRATORY_TRACT | 0 refills | Status: AC | PRN

## 2019-11-04 MED ORDER — METHYLPREDNISOLONE SODIUM SUCC 125 MG IJ SOLR: 125.0000 mg | Freq: Once | INTRAMUSCULAR | Status: DC | PRN

## 2019-11-04 MED ORDER — DIPHENHYDRAMINE HCL 50 MG/ML IJ SOLN: 50.0000 mg | Freq: Once | INTRAMUSCULAR | Status: DC | PRN

## 2019-11-04 MED ORDER — SODIUM CHLORIDE 0.9 % IV SOLN
700.0000 mg | Freq: Once | INTRAVENOUS | Status: AC
  Administered 2019-11-04: 700 mg via INTRAVENOUS
  Filled 2019-11-04: qty 20

## 2019-11-04 MED ORDER — SODIUM CHLORIDE 0.9 % IV SOLN: INTRAVENOUS | Status: DC | PRN

## 2019-11-04 MED ORDER — ALBUTEROL SULFATE HFA 108 (90 BASE) MCG/ACT IN AERS: 2.0000 | INHALATION_SPRAY | Freq: Once | RESPIRATORY_TRACT | Status: DC | PRN

## 2019-11-04 MED ORDER — FAMOTIDINE IN NACL 20-0.9 MG/50ML-% IV SOLN: 20.0000 mg | Freq: Once | INTRAVENOUS | Status: DC | PRN

## 2019-11-04 MED FILL — ALBUTEROL SULFATE HFA 108 (: 108 (90 BAS | 25 days supply | Qty: 9 | Fill #0

## 2019-11-04 MED FILL — AMLODIPINE BESYLATE 5 MG TA: 5 | 30 days supply | Qty: 60 | Fill #0

## 2019-11-04 MED FILL — ALLOPURINOL 300 MG TAB: 300 | 30 days supply | Qty: 30 | Fill #0

## 2019-11-04 MED FILL — PRAVASTATIN NA 40 MG TAB: 40 | 30 days supply | Qty: 30 | Fill #0

## 2019-11-04 NOTE — ED Provider Notes (Signed)
[facility] EMERGENCY DEPARTMENT Provider Note   CSN: [id number] Arrival date & time: 11/04/19  0502     History Chief Complaint  Patient presents with  . Sore Throat  . Generalized Body Aches    Brian Erickson is a 54 y.o. male.  HPI HPI Comments: Brian Erickson is a 54 y.o. male with a history of hypertension who presents to the Emergency Department complaining of chills.  He states he woke this morning began experiencing mild rhinorrhea, sore throat, generalized body aches, headache.  He decided to come to the emergency department for evaluation.  He states he woke with a headache at about 2:30 AM this morning and took 2 Tylenol which alleviated his symptoms.  He is not a smoker.  Patient denies shortness of breath, chest pain, nausea, vomiting, diarrhea, fevers, abdominal pain, dizziness, lightheadedness, syncope.    Past Medical History:  Diagnosis Date  . Felon of finger of left hand 01/26/2018  . 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 GERD (gastroesophageal reflux disease)   . Gout   . Infection of thumb    left +MRSA  . MRSA (methicillin resistant staph aureus) culture positive    current infection left thumb    Patient Active Problem List   Diagnosis Date Noted  . Medication monitoring encounter 02/20/2018  . Rash and nonspecific skin eruption 02/20/2018  . Finger osteomyelitis, left ([place]) 02/12/2018  . Felon of finger of left hand 01/26/2018  . Screening examination for STD (sexually transmitted disease) 12/24/2016    Past Surgical History:  Procedure Laterality Date  . I & D EXTREMITY Left 01/26/2018   Procedure: IRRIGATION AND DEBRIDEMENT LEFT THUMB;  Surgeon: Marchia Bond, MD;  Location: [place];  Service: Orthopedics;  Laterality: Left;  . I & D EXTREMITY Left 02/12/2018   Procedure: IRRIGATION AND DEBRIDEMENT OF LEFT THUMB;  Surgeon: Marchia Bond, MD;  Location: [place];  Service: Orthopedics;  Laterality:  Left;       No family history on file.  Social History   Tobacco Use  . Smoking status: Never Smoker  . Smokeless tobacco: Never Used  Substance Use Topics  . Alcohol use: No    Comment: socially   . Drug use: No    Home Medications Prior to Admission medications   Medication Sig Start Date End Date Taking? Authorizing Provider  acetaminophen (TYLENOL) 500 MG tablet Take 500 mg by mouth every 6 (six) hours as needed.    [provider]  allopurinol (ZYLOPRIM) 300 MG tablet Take 1 tablet (300 mg total) by mouth daily. 09/01/19   Kerin Perna, NP  amLODipine (NORVASC) 10 MG tablet Take 1 tablet (10 mg total) by mouth daily. 09/01/19   Kerin Perna, NP  pravastatin (PRAVACHOL) 40 MG tablet Take 1 tablet (40 mg total) by mouth daily. 09/09/19   Kerin Perna, NP    Allergies    Daptomycin  Review of Systems   Review of Systems  Constitutional: Positive for chills. Negative for fever.  HENT: Positive for congestion, postnasal drip, rhinorrhea and sore throat.   Respiratory: Negative for shortness of breath.   Cardiovascular: Negative for chest pain.  Gastrointestinal: Negative for abdominal pain, diarrhea, nausea and vomiting.  Musculoskeletal: Positive for myalgias.  Neurological: Negative for dizziness, syncope, light-headedness and numbness.  All other systems reviewed and are negative.   Physical Exam Updated Vital Signs BP 139/78 (BP Location: Left Arm)   Pulse 73  Temp 98.4 F (36.9 C) (Oral)   Resp 16   SpO2 97%   Physical Exam Vitals and nursing note reviewed.  Constitutional:      General: He is not in acute distress.    Appearance: He is well-developed and normal weight. He is not ill-appearing, toxic-appearing or diaphoretic.     Comments: Patient is a well-appearing adult African-American male.  He is sitting upright and speaking clearly and coherently.  He is pleasant to converse with.  HENT:     Head: Normocephalic and  atraumatic.     Nose: Congestion present.     Mouth/Throat:     Mouth: Mucous membranes are moist.     Pharynx: Oropharynx is clear.  Eyes:     Extraocular Movements:     Right eye: Normal extraocular motion.     Left eye: Normal extraocular motion.     Conjunctiva/sclera: Conjunctivae normal.     Pupils: Pupils are equal, round, and reactive to light.  Cardiovascular:     Rate and Rhythm: Normal rate and regular rhythm.     Heart sounds: No murmur. No friction rub. No gallop.   Pulmonary:     Effort: Pulmonary effort is normal. No respiratory distress.     Breath sounds: Normal breath sounds. No stridor. No wheezing, rhonchi or rales.     Comments: Lungs clear to auscultation bilaterally in the posterior lung fields.  No wheezes, rales, rhonchi. Abdominal:     Palpations: Abdomen is soft.     Tenderness: There is no abdominal tenderness.  Musculoskeletal:     Cervical back: Normal range of motion and neck supple.  Skin:    General: Skin is warm and dry.     Coloration: Skin is not pale.     Findings: No erythema or rash.  Neurological:     General: No focal deficit present.     Mental Status: He is alert and oriented to person, place, and time.  Psychiatric:        Mood and Affect: Mood normal.        Behavior: Behavior normal.     ED Results / Procedures / Treatments   Labs (all labs ordered are listed, but only abnormal results are displayed) Labs Reviewed  POC SARS CORONAVIRUS 2 AG -  ED - Abnormal; Notable for the following components:      Result Value   SARS Coronavirus 2 Ag POSITIVE (*)    All other components within normal limits  GROUP A STREP BY PCR    EKG None  Radiology No results found.  Procedures Procedures (including critical care time)  Medications Ordered in ED Medications - No data to display  ED Course  I have reviewed the triage vital signs and the nursing notes.  Pertinent labs & imaging results that were available during my care of  the patient were reviewed by me and considered in my medical decision making (see chart for details).    MDM Rules/Calculators/A&P                      8:32 AM patient is a well-appearing 54 year old African-American male that presents with a diagnosis of COVID-19.  He reports rhinorrhea, sore throat, chills, headache but denies any chest pain or shortness of breath at this time.  His physical exam is nonconcerning at this time.  Discussed the diagnosis with patient and that he should quarantine for the next 10 to 14 days.  He is also  being given an albuterol inhaler if he happens to develop shortness of breath or wheezing.  He was made aware to return the emergency department with any new or worsening symptoms.  He verbalized understanding of the above plan and he was amicable at the time of discharge.  Vital signs stable.  8:44 AM pharmacy came to discuss this patient.  Due to his comorbidities they recommend we move forward with bamlanivimab infusion.  Orders placed and will monitor patient.  He will be discharged following the procedure with the above recommendations.   Final Clinical Impression(s) / ED Diagnoses Final diagnoses:  COVID-19    Rx / DC Orders ED Discharge Orders         Ordered    albuterol (VENTOLIN HFA) 108 (90 Base) MCG/ACT inhaler  Every 6 hours PRN     11/04/19 0830           Placido Sou, PA-C 11/04/19 1610    Eber Hong, MD 11/05/19 2242

## 2019-11-04 NOTE — ED Notes (Signed)
Patient verbalizes understanding of discharge instructions. Opportunity for questioning and answers were provided. Armband removed by staff, pt discharged from ED.  

## 2019-11-04 NOTE — ED Triage Notes (Signed)
Pt reports sore throat, nasal drainage and generalized body aches.  Denies any SOB, N/V/D or weakness.

## 2019-11-04 NOTE — Discharge Instructions (Signed)
You have been diagnosed with COVID-19.  I prescribed you an albuterol inhaler to use as needed if you feel that you develop shortness of breath or wheezing.  As discussed, please be sure to quarantine for the next 10 to 14 days.  I also provided you a work note due to employer.  It was a pleasure meeting you.

## 2019-11-04 NOTE — ED Provider Notes (Signed)
Well-appearing 54 year old male presents with complaint of "cold-like symptoms.  Runny nose, mild sore throat but well-appearing with normal vitals, patient qualifies for been monoclonal antibioties, this has been ordered.  Patient is well-appearing otherwise and on my exam is clear lungs no distress.  Medical screening examination/treatment/procedure(s) were conducted as a shared visit with non-physician practitioner(s) and myself.  I personally evaluated the patient during the encounter.  Clinical Impression:   Final diagnoses:  COVID-19         Eber Hong, MD 11/05/19 2242

## 2019-11-05 ENCOUNTER — Telehealth: Payer: Self-pay | Admitting: Physician Assistant

## 2019-11-05 ENCOUNTER — Encounter: Payer: Self-pay | Admitting: Physician Assistant

## 2019-11-05 NOTE — Telephone Encounter (Signed)
Called to discuss with patient about Covid symptoms and the use of bamlanivimab or casirivimab/imdevimab, a monoclonal antibody infusion for those with mild to moderate Covid symptoms and at a high risk of hospitalization.  Pt is qualified for this infusion at the [facility] infusion center due to Age >55 with HTN.    Message left to call back  Cline Crock PA-C  [facility]

## 2019-11-23 ENCOUNTER — Encounter (HOSPITAL_COMMUNITY): Payer: Self-pay | Admitting: *Deleted

## 2019-11-23 ENCOUNTER — Emergency Department (HOSPITAL_COMMUNITY)
Admission: EM | Admit: 2019-11-23 | Discharge: 2019-11-23 | Disposition: A | Discharge status: Home / Self Care | Payer: Medicaid Other | Attending: Emergency Medicine | Admitting: Emergency Medicine

## 2019-11-23 ENCOUNTER — Other Ambulatory Visit: Payer: Self-pay

## 2019-11-23 DIAGNOSIS — I10 Essential (primary) hypertension: Secondary | ICD-10-CM | POA: Insufficient documentation

## 2019-11-23 DIAGNOSIS — G5602 Carpal tunnel syndrome, left upper limb: Secondary | ICD-10-CM | POA: Insufficient documentation

## 2019-11-23 DIAGNOSIS — Z79899 Other long term (current) drug therapy: Secondary | ICD-10-CM | POA: Insufficient documentation

## 2019-11-23 MED ORDER — IBUPROFEN 400 MG PO TABS: 400.0000 mg | ORAL_TABLET | Freq: Four times a day (QID) | ORAL | 0 refills | Status: AC | PRN

## 2019-11-23 NOTE — Discharge Instructions (Addendum)
You were seen in the ER for left thumb, pointer and middle finger numbness.  It is likely that part of the numbness in your fingers is caused by a condition called carpal tunnel which is a compression of the median nerve that runs through your wrist and fingers.  Will provide a wrist splint to wear at night.  We have prescribed ibuprofen, take every 6 hours as needed.  You can alternate this with your Tylenol.  I provided the phone number for Dr. Ardyth Gal thumb surgery-please make sure to schedule an appointment with him as this could be a complication of your previous thumb surgery

## 2019-11-23 NOTE — ED Triage Notes (Signed)
The pt is c/o  Lt thumb index and middle finger numbness for 3 days  The numbness goes from the middle of all three finger to the tips  No known injury  No pain.  The pt  Had a positive covid 3 weeks ago with mild symptoms

## 2019-11-23 NOTE — ED Provider Notes (Signed)
[facility] EMERGENCY DEPARTMENT Provider Note   CSN: [id number] Arrival date & time: 11/23/19  3220     History Chief Complaint  Patient presents with  . finger tips numb    Brian Erickson is a 54 y.o. male.  HPI 54 year old male with a history of left thumb osteomyelitis requiring surgical debridement performed by Dr. Mardelle Matte in 2019 presents for numbness that  goes from his DIP joint to the tip of his fingers in all 3 fingers.  Pain is intermittent and the patient cannot identify a pattern.  He does state that it is worse in the mornings when he first wakes up and then subsides as the day goes on. He reports that he has had these episodes since his debridement but they have become more frequent in the last 2 weeks. He cannot recall any injury or inciting even that would cause his increase in numbness. He takes over the counter tylenol with mild relief.  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  Denies weakness, fevers, erythema, insect bites, upper arm weakness or neck pain.    Past Medical History:  Diagnosis Date  . Felon of finger of left hand 01/26/2018  . Finger osteomyelitis, left ([place]) 02/12/2018  . GERD (gastroesophageal reflux disease)   . Gout   . HTN (hypertension)   . Infection of thumb    left +MRSA  . MRSA (methicillin resistant staph aureus) culture positive    current infection left thumb    Patient Active Problem List   Diagnosis Date Noted  . Medication monitoring encounter 02/20/2018  . Rash and nonspecific skin eruption 02/20/2018  . Finger osteomyelitis, left ([place]) 02/12/2018  . Felon of finger of left hand 01/26/2018  . Screening examination for STD (sexually transmitted disease) 12/24/2016    Past Surgical History:  Procedure Laterality Date  . I & D EXTREMITY Left 01/26/2018   Procedure: IRRIGATION AND DEBRIDEMENT LEFT THUMB;  Surgeon: Marchia Bond, MD;  Location: [place];   Service: Orthopedics;  Laterality: Left;  . I & D EXTREMITY Left 02/12/2018   Procedure: IRRIGATION AND DEBRIDEMENT OF LEFT THUMB;  Surgeon: Marchia Bond, MD;  Location: [place];  Service: Orthopedics;  Laterality: Left;       No family history on file.  Social History   Tobacco Use  . Smoking status: Never Smoker  . Smokeless tobacco: Never Used  Substance Use Topics  . Alcohol use: No    Comment: socially   . Drug use: No    Home Medications Prior to Admission medications   Medication Sig Start Date End Date Taking? Authorizing Provider  acetaminophen (TYLENOL) 500 MG tablet Take 500 mg by mouth every 6 (six) hours as needed.    [provider]  albuterol (VENTOLIN HFA) 108 (90 Base) MCG/ACT inhaler Inhale 1-2 puffs into the lungs every 6 (six) hours as needed for wheezing or shortness of breath. 11/04/19   Rayna Sexton, PA-C  allopurinol (ZYLOPRIM) 300 MG tablet Take 1 tablet (300 mg total) by mouth daily. 09/01/19   Kerin Perna, NP  amLODipine (NORVASC) 10 MG tablet Take 1 tablet (10 mg total) by mouth daily. 09/01/19   Kerin Perna, NP  ibuprofen (ADVIL) 400 MG tablet Take 1 tablet (400 mg total) by mouth every 6 (six) hours as needed. 11/23/19   Garald Balding, PA-C  pravastatin (PRAVACHOL) 40 MG tablet Take 1  tablet (40 mg total) by mouth daily. 09/09/19   Grayce Sessions, NP    Allergies    Daptomycin  Review of Systems   Review of Systems  Musculoskeletal: Negative for arthralgias, joint swelling, myalgias, neck pain and neck stiffness.  Skin: Negative for color change, pallor, rash and wound.  Neurological: Positive for numbness (In left thumb, index and middle finger).    Physical Exam Updated Vital Signs BP (!) 150/99   Pulse 67   Temp 98.7 F (37.1 C)   Resp 15   Ht 5\' 9"  (1.753 m)   Wt 84.4 kg   SpO2 100%   BMI 27.47 kg/m   Physical Exam Vitals reviewed.  Constitutional:      Appearance: Normal  appearance.  Cardiovascular:     Rate and Rhythm: Normal rate and regular rhythm.  Pulmonary:     Effort: Pulmonary effort is normal.     Breath sounds: Normal breath sounds.  Musculoskeletal:        General: No swelling, tenderness, deformity or signs of injury.     Comments: Left thumb, second, third fingers without erythema, swelling, fluctuance, no visible deformities.  Positive Phalen's sign, negative Tinel's. Full range of motion and strength,  sensations intact at this time   Skin:    General: Skin is warm and dry.     Findings: No erythema, lesion or rash.  Neurological:     Mental Status: He is alert.     Sensory: No sensory deficit.     Motor: No weakness.     Deep Tendon Reflexes: Reflexes normal.     ED Results / Procedures / Treatments   Labs (all labs ordered are listed, but only abnormal results are displayed) Labs Reviewed - No data to display  EKG None  Radiology No results found.  Procedures Procedures (including critical care time)  Medications Ordered in ED Medications - No data to display  ED Course  I have reviewed the triage vital signs and the nursing notes.  Pertinent labs & imaging results that were available during my care of the patient were reviewed by me and considered in my medical decision making (see chart for details).    MDM Rules/Calculators/A&P                     54 year old male with a history of left thumb cellulitis presents for intermittent numbness in his left thumb, index finger, middle finger. Patient mildly hypertensive, afebrile.  Vitals nonconcerning.  On physical exam he does have reproducible numbness with Phalen sign, negative Tinel's. No sign of erythema, fluctuance, redness, drainage.  Full range of motion and strength of all 5 fingers/hand. No weakness in upper extremities. No neck pain. Suspect carpal tunnel component given positive Phalen sign and distribution of numbness along the median nerve.  Doubt abscess,  cellulitis, disk herniation, cervical radiculopathy. Will provide cockup splint to wear at night and ibuprofen. Counselled patient on carpal tunnel. Stressed followup with Dr. 59 as some of his numbness could be a complication of his debridement as he states the numbness has been intermittent since his surgery. Pt voiced understanding and is agreeable to this plan. Return precautions given which include swelling, redness, drainage, fever, sudden/ new onset weakness.    Final Clinical Impression(s) / ED Diagnoses Final diagnoses:  Carpal tunnel syndrome on left    Rx / DC Orders ED Discharge Orders         Ordered    ibuprofen (ADVIL)  400 MG tablet  Every 6 hours PRN     11/23/19 0724           Mare Ferrari, PA-C 11/23/19 [id number]    Gwyneth Sprout, MD 11/23/19 1406

## 2020-04-04 ENCOUNTER — Other Ambulatory Visit: Payer: Self-pay

## 2020-04-04 ENCOUNTER — Ambulatory Visit (INDEPENDENT_AMBULATORY_CARE_PROVIDER_SITE_OTHER): Payer: Self-pay | Admitting: Primary Care

## 2020-04-04 ENCOUNTER — Encounter (INDEPENDENT_AMBULATORY_CARE_PROVIDER_SITE_OTHER): Payer: Self-pay | Admitting: Primary Care

## 2020-04-04 VITALS — BP 175/94 | HR 74 | Temp 97.8°F | Ht 69.0 in | Wt 189.4 lb

## 2020-04-04 DIAGNOSIS — M109 Gout, unspecified: Secondary | ICD-10-CM

## 2020-04-04 DIAGNOSIS — E782 Mixed hyperlipidemia: Secondary | ICD-10-CM

## 2020-04-04 DIAGNOSIS — Z0000 Encounter for general adult medical examination without abnormal findings: Secondary | ICD-10-CM

## 2020-04-04 DIAGNOSIS — R21 Rash and other nonspecific skin eruption: Secondary | ICD-10-CM

## 2020-04-04 DIAGNOSIS — Z1211 Encounter for screening for malignant neoplasm of colon: Secondary | ICD-10-CM

## 2020-04-04 DIAGNOSIS — I10 Essential (primary) hypertension: Secondary | ICD-10-CM

## 2020-04-04 MED ORDER — AMLODIPINE BESYLATE 10 MG PO TABS: 10.0000 mg | ORAL_TABLET | Freq: Every day | ORAL | 1 refills | Status: AC

## 2020-04-04 MED ORDER — ALLOPURINOL 300 MG PO TABS: 300.0000 mg | ORAL_TABLET | Freq: Every day | ORAL | 1 refills | Status: AC

## 2020-04-04 MED ORDER — HYDROCHLOROTHIAZIDE 25 MG PO TABS: 25.0000 mg | ORAL_TABLET | Freq: Every day | ORAL | 3 refills | Status: AC

## 2020-04-04 NOTE — Patient Instructions (Signed)

## 2020-04-04 NOTE — Progress Notes (Signed)
Established Patient Office Visit  Subjective:  Patient ID: Brian Erickson, male    DOB: Mar 19, 1966  Age: 54 y.o. MRN: [id number]  CC:  Chief Complaint  Patient presents with   Annual Exam   Medication Refill    HPI Mr.Brian Erickson is a 54 year old  presents for management of hypertension and annual exam. 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   GERD (gastroesophageal reflux disease)    Gout    HTN (hypertension)    Infection of thumb    left +MRSA   MRSA (methicillin resistant staph aureus) culture positive    current infection left thumb    Past Surgical History:  Procedure Laterality Date   I & D EXTREMITY Left 01/26/2018   Procedure: IRRIGATION AND DEBRIDEMENT LEFT THUMB;  Surgeon: Marchia Bond, MD;  Location: [place];  Service: Orthopedics;  Laterality: Left;   I & D EXTREMITY Left 02/12/2018   Procedure: IRRIGATION AND DEBRIDEMENT OF LEFT THUMB;  Surgeon: Marchia Bond, MD;  Location: [place];  Service: Orthopedics;  Laterality: Left;    No family history on file.  Social History   Socioeconomic History   Marital status: Single    Spouse name: Not on file   Number of children: Not on file   Years of education: Not on file   Highest education level: Not on file  Occupational History   Not on file  Tobacco Use   Smoking status: Never Smoker   Smokeless tobacco: Never Used  Vaping Use   Vaping Use: Never used  Substance and Sexual Activity   Alcohol use: No    Comment: socially    Drug use: No   Sexual activity: Not on file  Other Topics Concern   Not on file  Social History Narrative   Not on file   Social Determinants of Health   Financial Resource Strain:    Difficulty of Paying Living Expenses:   Food Insecurity:    Worried About Paediatric nurse in the Last Year:    Arboriculturist in the Last Year:   Transportation Needs:    Film/video editor (Medical):    Lack of Transportation (Non-Medical):   Physical Activity:    Days of Exercise per Week:    Minutes of Exercise per Session:   Stress:    Feeling of Stress :   Social Connections:    Frequency of Communication with Friends and Family:    Frequency of Social Gatherings with Friends and Family:    Attends Religious Services:    Active Member of Clubs or Organizations:    Attends Music therapist:    Marital Status:   Intimate Partner Violence:    Fear of Current or Ex-Partner:    Emotionally Abused:    Physically Abused:    Sexually Abused:     Outpatient Medications Prior to Visit  Medication Sig Dispense Refill   acetaminophen (TYLENOL) 500 MG tablet Take 500 mg by mouth every 6 (six) hours as needed. (Patient not taking: Reported on 04/04/2020)     albuterol (VENTOLIN HFA) 108 (90 Base) MCG/ACT inhaler Inhale 1-2 puffs into the lungs every 6 (six) hours as needed for wheezing or shortness of breath. (Patient not taking: Reported on 04/04/2020) 8 g 0   allopurinol (  ZYLOPRIM) 300 MG tablet Take 1 tablet (300 mg total) by mouth daily. (Patient not taking: Reported on 04/04/2020) 90 tablet 1   amLODipine (NORVASC) 10 MG tablet Take 1 tablet (10 mg total) by mouth daily. (Patient not taking: Reported on 04/04/2020) 90 tablet 1   ibuprofen (ADVIL) 400 MG tablet Take 1 tablet (400 mg total) by mouth every 6 (six) hours as needed. (Patient not taking: Reported on 04/04/2020) 30 tablet 0   pravastatin (PRAVACHOL) 40 MG tablet Take 1 tablet (40 mg total) by mouth daily. (Patient not taking: Reported on 04/04/2020) 90 tablet 1   No facility-administered medications prior to visit.    Allergies  Allergen Reactions   Daptomycin     Rhabdomyolysis     ROS Review of Systems  All other systems reviewed and are negative.       Objective:    Physical Exam Vitals reviewed.  Constitutional:      Appearance: Normal appearance.  HENT:     Head: Normocephalic.     Right Ear: Tympanic membrane normal.     Left Ear: Tympanic membrane normal.     Nose: Nose normal.  Eyes:     Extraocular Movements: Extraocular movements intact.     Pupils: Pupils are equal, round, and reactive to light.  Cardiovascular:     Rate and Rhythm: Regular rhythm.     Pulses: Normal pulses.     Heart sounds: Normal heart sounds.  Pulmonary:     Effort: Pulmonary effort is normal.     Breath sounds: Normal breath sounds.  Abdominal:     General: Bowel sounds are normal.  Musculoskeletal:        General: Normal range of motion.     Cervical back: Normal range of motion and neck supple.  Skin:    General: Skin is warm and dry.     Findings: Lesion present.     Comments: All over body  Neurological:     Mental Status: He is alert and oriented to person, place, and time.  Psychiatric:        Mood and Affect: Mood normal.        Behavior: Behavior normal.        Thought Content: Thought content normal.        Judgment: Judgment normal.     BP (!) 175/94 (BP Location: Right Arm, Patient Position: Sitting, Cuff Size: Normal)    Pulse 74    Temp 97.8 F (36.6 C) (Oral)    Ht '5\' 9"'  (1.753 m)    Wt 189 lb 6.4 oz (85.9 kg)    SpO2 96%    BMI 27.97 kg/m  Wt Readings from Last 3 Encounters:  04/04/20 189 lb 6.4 oz (85.9 kg)  11/23/19 186 lb (84.4 kg)  10/08/19 187 lb 9.6 oz (85.1 kg)     Health Maintenance Due  Topic Date Due   COLONOSCOPY  Never done   INFLUENZA VACCINE  03/27/2020    There are no preventive care reminders to display for this patient.  No results found for: TSH Lab Results  Component Value Date   WBC 5.9 03/02/2019   HGB 15.4 03/02/2019   HCT 46.0 03/02/2019   MCV 87 03/02/2019   PLT 321 03/02/2019   Lab Results  Component Value Date   NA 141 07/14/2019   K 4.2 07/14/2019   CO2 20 07/14/2019    GLUCOSE 94 07/14/2019   BUN 11 07/14/2019   CREATININE 1.37 (H) 07/14/2019  BILITOT 0.9 07/14/2019   ALKPHOS 99 07/14/2019   AST 17 07/14/2019   ALT 10 07/14/2019   PROT 7.1 07/14/2019   ALBUMIN 4.4 07/14/2019   CALCIUM 9.7 07/14/2019   ANIONGAP 7 03/01/2018   Lab Results  Component Value Date   CHOL 269 (H) 09/08/2019   Lab Results  Component Value Date   HDL 36 (L) 09/08/2019   Lab Results  Component Value Date   LDLCALC 210 (H) 09/08/2019   Lab Results  Component Value Date   TRIG 127 09/08/2019   Lab Results  Component Value Date   CHOLHDL 7.5 (H) 09/08/2019   No results found for: HGBA1C    Assessment & Plan:  Vong was seen today for annual exam and medication refill.  Diagnoses and all orders for this visit:  Physical exam -     CBC with Differential -     CMP14+EGFR  Essential hypertension Counseled on blood pressure goal of less than 130/80, low-sodium, DASH diet, medication compliance, 150 minutes of moderate intensity exercise per week. Prescribed amlodipine 29m and HCTZ 259mdaily  -     CMP14+EGFR  Colon cancer screening -     Fecal occult blood, imunochemical; Future  Mixed hyperlipidemia  Decrease your fatty foods, red meat, cheese, milk and increase fiber like whole grains and veggies. You can also add a fiber supplement like Metamucil or Benefiber.  -     Lipid Panel  Rash and nonspecific skin eruption      Follow-up: No follow-ups on file.    MiKerin PernaNP

## 2020-04-05 ENCOUNTER — Ambulatory Visit (INDEPENDENT_AMBULATORY_CARE_PROVIDER_SITE_OTHER): Payer: Medicaid Other | Admitting: Primary Care

## 2020-04-05 ENCOUNTER — Other Ambulatory Visit (INDEPENDENT_AMBULATORY_CARE_PROVIDER_SITE_OTHER): Payer: Self-pay | Admitting: Primary Care

## 2020-04-05 DIAGNOSIS — E782 Mixed hyperlipidemia: Secondary | ICD-10-CM

## 2020-04-05 LAB — CMP14+EGFR
ALT: 15 IU/L (ref 0–44)
AST: 20 IU/L (ref 0–40)
Albumin/Globulin Ratio: 1.5 (ref 1.2–2.2)
Albumin: 4.4 g/dL (ref 3.8–4.9)
Alkaline Phosphatase: 118 IU/L (ref 48–121)
BUN/Creatinine Ratio: 10 (ref 9–20)
BUN: 13 mg/dL (ref 6–24)
Bilirubin Total: 0.4 mg/dL (ref 0.0–1.2)
CO2: 20 mmol/L (ref 20–29)
Calcium: 9.6 mg/dL (ref 8.7–10.2)
Chloride: 101 mmol/L (ref 96–106)
Creatinine, Ser: 1.35 mg/dL — ABNORMAL HIGH (ref 0.76–1.27)
GFR calc Af Amer: 67 mL/min/{1.73_m2} (ref >59)
GFR calc non Af Amer: 58 mL/min/{1.73_m2} — ABNORMAL LOW (ref >59)
Globulin, Total: 3 g/dL (ref 1.5–4.5)
Glucose: 160 mg/dL — ABNORMAL HIGH (ref 65–99)
Potassium: 4 mmol/L (ref 3.5–5.2)
Sodium: 137 mmol/L (ref 134–144)
Total Protein: 7.4 g/dL (ref 6.0–8.5)

## 2020-04-05 LAB — LIPID PANEL
Chol/HDL Ratio: 6.9 ratio — ABNORMAL HIGH (ref 0.0–5.0)
Cholesterol, Total: 257 mg/dL — ABNORMAL HIGH (ref 100–199)
HDL: 37 mg/dL — ABNORMAL LOW (ref >39)
LDL Chol Calc (NIH): 170 mg/dL — ABNORMAL HIGH (ref 0–99)
Triglycerides: 262 mg/dL — ABNORMAL HIGH (ref 0–149)
VLDL Cholesterol Cal: 50 mg/dL — ABNORMAL HIGH (ref 5–40)

## 2020-04-05 LAB — CBC WITH DIFFERENTIAL/PLATELET
Basophils Absolute: 0.1 10*3/uL (ref 0.0–0.2)
Basos: 1 %
EOS (ABSOLUTE): 0.2 10*3/uL (ref 0.0–0.4)
Eos: 3 %
Hematocrit: 47.7 % (ref 37.5–51.0)
Hemoglobin: 16.1 g/dL (ref 13.0–17.7)
Immature Grans (Abs): 0 10*3/uL (ref 0.0–0.1)
Immature Granulocytes: 1 %
Lymphocytes Absolute: 2.3 10*3/uL (ref 0.7–3.1)
Lymphs: 40 %
MCH: 29.5 pg (ref 26.6–33.0)
MCHC: 33.8 g/dL (ref 31.5–35.7)
MCV: 87 fL (ref 79–97)
Monocytes Absolute: 0.3 10*3/uL (ref 0.1–0.9)
Monocytes: 5 %
Neutrophils Absolute: 2.9 10*3/uL (ref 1.4–7.0)
Neutrophils: 50 %
Platelets: 275 10*3/uL (ref 150–450)
RBC: 5.46 x10E6/uL (ref 4.14–5.80)
RDW: 13.2 % (ref 11.6–15.4)
WBC: 5.8 10*3/uL (ref 3.4–10.8)

## 2020-04-05 MED ORDER — ATORVASTATIN CALCIUM 80 MG PO TABS: 80.0000 mg | ORAL_TABLET | Freq: Every day | ORAL | 3 refills | Status: DC

## 2020-04-05 MED ORDER — ATORVASTATIN CALCIUM 80 MG PO TABS: 80.0000 mg | ORAL_TABLET | Freq: Every day | ORAL | 3 refills | Status: AC

## 2020-04-06 MED FILL — ATORVASTATIN 80 MG TABLET: 80 | 30 days supply | Qty: 30 | Fill #0

## 2020-04-12 ENCOUNTER — Encounter (INDEPENDENT_AMBULATORY_CARE_PROVIDER_SITE_OTHER): Payer: Self-pay

## 2020-05-16 IMAGING — XA IR PICC >5YO
1 series · 1 of 1 positions shown · non-contrast
Comparison: none

INDICATION: Infection of the left thumb. Request PICC line placement prior to
surgical procedure.

[Series 300: ir picc placement right >5 yrs inc img g · 1 of 1 slices shown]
[im 1/1]
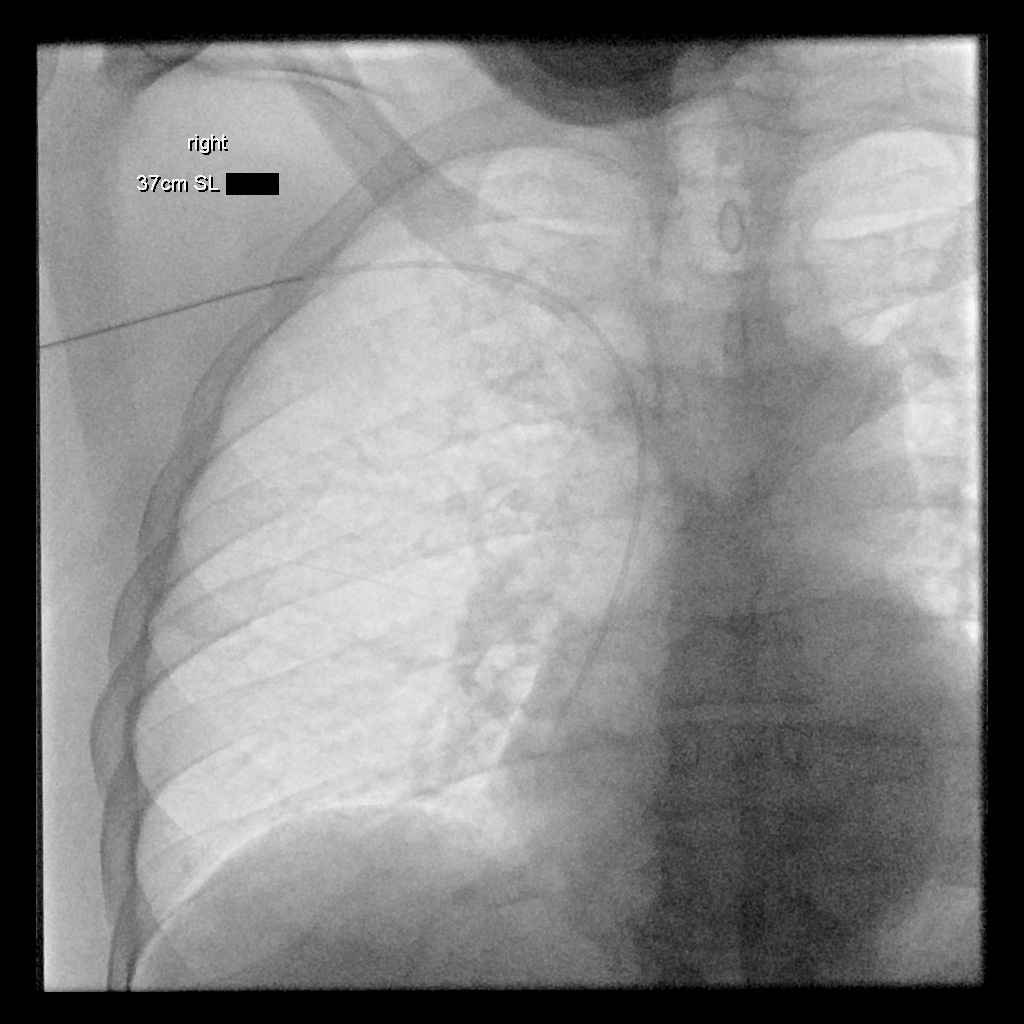

[1 of 1 positions shown; findings below may reference images not displayed]

EXAM:
RIGHT UPPER EXTREMITY PICC LINE PLACEMENT WITH ULTRASOUND AND
FLUOROSCOPIC GUIDANCE

MEDICATIONS:
None;

ANESTHESIA/SEDATION:
Moderate Sedation Time:  None

The patient was continuously monitored during the procedure by the
interventional radiology nurse under my direct supervision.

FLUOROSCOPY TIME:  Fluoroscopy Time: 6 seconds

COMPLICATIONS:
None immediate.

PROCEDURE:
The patient was advised of the possible risks and complications and
agreed to undergo the procedure. The patient was then brought to the
angiographic suite for the procedure.

The right arm was prepped with chlorhexidine, draped in the usual
sterile fashion using maximum barrier technique (cap and mask,
sterile gown, sterile gloves, large sterile sheet, hand hygiene and
cutaneous antiseptic). Local anesthesia was attained by infiltration
with 1% lidocaine.

Ultrasound demonstrated patency of the basilic vein, and this was
documented with an image. Under real-time ultrasound guidance, this
vein was accessed with a 21 gauge micropuncture needle and image
documentation was performed. The needle was exchanged over a
guidewire for a peel-away sheath through which a 37 cm 5 French
single lumen power injectable PICC was advanced, and positioned with
its tip at the lower SVC/right atrial junction. Fluoroscopy during
the procedure and fluoro spot radiograph confirms appropriate
catheter position. The catheter was flushed, secured to the skin,
and covered with a sterile dressing.
IMPRESSION: Successful placement of a right arm PICC with sonographic and
fluoroscopic guidance. The catheter is ready for use.

## 2020-06-06 ENCOUNTER — Ambulatory Visit (INDEPENDENT_AMBULATORY_CARE_PROVIDER_SITE_OTHER): Payer: Medicaid Other | Admitting: Primary Care

## 2020-12-22 ENCOUNTER — Ambulatory Visit (INDEPENDENT_AMBULATORY_CARE_PROVIDER_SITE_OTHER): Payer: Medicaid Other | Admitting: Primary Care

## 2021-02-17 ENCOUNTER — Ambulatory Visit (INDEPENDENT_AMBULATORY_CARE_PROVIDER_SITE_OTHER): Payer: Medicaid Other | Admitting: Primary Care

## 2021-07-16 ENCOUNTER — Encounter (HOSPITAL_COMMUNITY): Payer: Self-pay | Admitting: Emergency Medicine

## 2021-07-16 ENCOUNTER — Emergency Department (HOSPITAL_COMMUNITY)
Admission: EM | Admit: 2021-07-16 | Discharge: 2021-07-16 | Disposition: A | Discharge status: Home / Self Care | Payer: Medicaid Other | Attending: Emergency Medicine | Admitting: Emergency Medicine

## 2021-07-16 ENCOUNTER — Other Ambulatory Visit: Payer: Self-pay

## 2021-07-16 DIAGNOSIS — T23261A Burn of second degree of back of right hand, initial encounter: Secondary | ICD-10-CM | POA: Insufficient documentation

## 2021-07-16 DIAGNOSIS — Z23 Encounter for immunization: Secondary | ICD-10-CM | POA: Insufficient documentation

## 2021-07-16 DIAGNOSIS — I10 Essential (primary) hypertension: Secondary | ICD-10-CM | POA: Insufficient documentation

## 2021-07-16 DIAGNOSIS — Z79899 Other long term (current) drug therapy: Secondary | ICD-10-CM | POA: Insufficient documentation

## 2021-07-16 DIAGNOSIS — T23201A Burn of second degree of right hand, unspecified site, initial encounter: Secondary | ICD-10-CM

## 2021-07-16 DIAGNOSIS — X088XXA Exposure to other specified smoke, fire and flames, initial encounter: Secondary | ICD-10-CM | POA: Insufficient documentation

## 2021-07-16 MED ORDER — OXYCODONE-ACETAMINOPHEN 5-325 MG PO TABS: 2.0000 | ORAL_TABLET | Freq: Four times a day (QID) | ORAL | 0 refills | Status: AC | PRN

## 2021-07-16 MED ORDER — SILVER SULFADIAZINE 1 % EX CREA
TOPICAL_CREAM | Freq: Two times a day (BID) | CUTANEOUS | Status: DC
Start: 2021-07-16 — End: 2021-07-16
  Filled 2021-07-16: qty 85

## 2021-07-16 MED ORDER — MORPHINE SULFATE (PF) 4 MG/ML IV SOLN
4.0000 mg | Freq: Once | INTRAVENOUS | Status: AC
  Administered 2021-07-16: 4 mg via INTRAVENOUS
  Filled 2021-07-16: qty 1

## 2021-07-16 MED ORDER — SILVER SULFADIAZINE 1 % EX CREA
TOPICAL_CREAM | Freq: Once | CUTANEOUS | Status: AC
  Filled 2021-07-16: qty 85

## 2021-07-16 MED ORDER — OXYCODONE-ACETAMINOPHEN 5-325 MG PO TABS
2.0000 | ORAL_TABLET | Freq: Once | ORAL | Status: AC
  Administered 2021-07-16: 2 via ORAL
  Filled 2021-07-16: qty 2

## 2021-07-16 MED ORDER — TETANUS-DIPHTH-ACELL PERTUSSIS 5-2.5-18.5 LF-MCG/0.5 IM SUSY
0.5000 mL | PREFILLED_SYRINGE | Freq: Once | INTRAMUSCULAR | Status: AC
  Administered 2021-07-16: 0.5 mL via INTRAMUSCULAR
  Filled 2021-07-16: qty 0.5
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
## 2021-07-16 NOTE — Discharge Instructions (Addendum)
Call [facility] at [phone] for follow up Do dressing changes daily and apply silvadene before dressing each day. Return if any increased redness around edges of burn, fever, streaking or other symptoms of infection.

## 2023-07-03 ENCOUNTER — Encounter (HOSPITAL_COMMUNITY): Payer: Self-pay | Admitting: Emergency Medicine

## 2023-07-03 ENCOUNTER — Other Ambulatory Visit: Payer: Self-pay

## 2023-07-03 ENCOUNTER — Emergency Department (HOSPITAL_COMMUNITY)
Admission: EM | Admit: 2023-07-03 | Discharge: 2023-07-03 | Disposition: A | Discharge status: Home / Self Care | Payer: Medicaid Other | Attending: Student | Admitting: Student

## 2023-07-03 ENCOUNTER — Other Ambulatory Visit (HOSPITAL_COMMUNITY): Payer: Self-pay

## 2023-07-03 DIAGNOSIS — Z79899 Other long term (current) drug therapy: Secondary | ICD-10-CM | POA: Insufficient documentation

## 2023-07-03 DIAGNOSIS — I10 Essential (primary) hypertension: Secondary | ICD-10-CM | POA: Insufficient documentation

## 2023-07-03 DIAGNOSIS — Z23 Encounter for immunization: Secondary | ICD-10-CM | POA: Diagnosis not present

## 2023-07-03 DIAGNOSIS — L0201 Cutaneous abscess of face: Secondary | ICD-10-CM | POA: Insufficient documentation

## 2023-07-03 MED ORDER — HYDROCODONE-ACETAMINOPHEN 5-325 MG PO TABS
1.0000 | ORAL_TABLET | Freq: Once | ORAL | Status: AC
  Administered 2023-07-03: 1 via ORAL
  Filled 2023-07-03: qty 1

## 2023-07-03 MED ORDER — LIDOCAINE HCL (PF) 1 % IJ SOLN
5.0000 mL | Freq: Once | INTRAMUSCULAR | Status: DC
  Filled 2023-07-03: qty 5

## 2023-07-03 MED ORDER — TETRACAINE HCL 0.5 % OP SOLN
2.0000 [drp] | Freq: Once | OPHTHALMIC | Status: DC
  Filled 2023-07-03: qty 4

## 2023-07-03 MED ORDER — DOXYCYCLINE HYCLATE 100 MG PO TABS
100.0000 mg | ORAL_TABLET | Freq: Once | ORAL | Status: AC
  Administered 2023-07-03: 100 mg via ORAL
  Filled 2023-07-03: qty 1

## 2023-07-03 MED ORDER — DOXYCYCLINE HYCLATE 100 MG PO TABS
100.0000 mg | ORAL_TABLET | Freq: Two times a day (BID) | ORAL | 0 refills | Status: AC
  Filled 2023-07-03: qty 20, 10d supply, fill #0

## 2023-07-03 MED ORDER — TETANUS-DIPHTH-ACELL PERTUSSIS 5-2.5-18.5 LF-MCG/0.5 IM SUSY
0.5000 mL | PREFILLED_SYRINGE | Freq: Once | INTRAMUSCULAR | Status: AC
  Administered 2023-07-03: 0.5 mL via INTRAMUSCULAR
  Filled 2023-07-03: qty 0.5

## 2023-07-03 MED ORDER — FLUORESCEIN SODIUM 1 MG OP STRP
1.0000 | ORAL_STRIP | Freq: Once | OPHTHALMIC | Status: DC
  Filled 2023-07-03: qty 1
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
## 2023-07-03 NOTE — Discharge Instructions (Addendum)
It was a pleasure taking care of you today.  As discussed, your abscess was drained in the ER.  I was able to get a small amount of pus.  I am sending you home with an antibiotic.  Take as prescribed and finish all antibiotics.  Please have eye rechecked in 2 days by PCP.  I have included the number of the eye doctor.  Please call to schedule an appointment symptoms if symptoms do not improve over the next 2 to 3 days.  Use warm compresses to right eyebrow multiple times a day for the next few days.  Return to the ER for new or worsening symptoms.  You may take over the counter ibuprofen or tylenol as needed for pain.
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
## 2023-09-23 ENCOUNTER — Other Ambulatory Visit (HOSPITAL_COMMUNITY): Payer: Self-pay
